# Patient Record
Sex: Male | Born: 1955 | Race: White | Hispanic: No | Marital: Single | Smoking: Former smoker
Health system: Southern US, Community
[De-identification: ages and names within clinical notes are randomized; demographics above are authoritative.]

## PROBLEM LIST (undated history)

## (undated) DIAGNOSIS — C61 Malignant neoplasm of prostate: Secondary | ICD-10-CM

## (undated) DIAGNOSIS — Z923 Personal history of irradiation: Secondary | ICD-10-CM

## (undated) HISTORY — PX: HERNIA REPAIR: SHX51

## (undated) HISTORY — PX: FOOT SURGERY: SHX648

---

## 2004-11-25 ENCOUNTER — Ambulatory Visit (HOSPITAL_COMMUNITY): Admission: RE | Admit: 2004-11-25 | Discharge: 2004-11-25 | Payer: Self-pay | Admitting: Surgery

## 2004-11-25 ENCOUNTER — Ambulatory Visit (HOSPITAL_BASED_OUTPATIENT_CLINIC_OR_DEPARTMENT_OTHER): Admission: RE | Admit: 2004-11-25 | Discharge: 2004-11-25 | Payer: Self-pay | Admitting: Surgery

## 2010-11-11 ENCOUNTER — Other Ambulatory Visit: Payer: Self-pay | Admitting: Gastroenterology

## 2010-11-11 ENCOUNTER — Ambulatory Visit (HOSPITAL_COMMUNITY)
Admission: RE | Admit: 2010-11-11 | Discharge: 2010-11-11 | Disposition: A | Discharge status: Home / Self Care | Payer: No Typology Code available for payment source | Source: Ambulatory Visit | Attending: Gastroenterology | Admitting: Gastroenterology

## 2010-11-11 DIAGNOSIS — Z860100 Personal history of colon polyps, unspecified: Secondary | ICD-10-CM | POA: Insufficient documentation

## 2010-11-11 DIAGNOSIS — D126 Benign neoplasm of colon, unspecified: Secondary | ICD-10-CM | POA: Insufficient documentation

## 2010-11-11 DIAGNOSIS — Z800 Family history of malignant neoplasm of digestive organs: Secondary | ICD-10-CM | POA: Insufficient documentation

## 2010-11-11 DIAGNOSIS — K5730 Diverticulosis of large intestine without perforation or abscess without bleeding: Secondary | ICD-10-CM | POA: Insufficient documentation

## 2010-11-16 NOTE — Op Note (Signed)
  Casey Wilcox, TREPTOW NO.:  [id number]  MEDICAL RECORD NO.:  [id number]           PATIENT TYPE:  O  LOCATION:  [facility]                         FACILITY:  [facility]  PHYSICIAN:  Danise Edge, M.D.   DATE OF BIRTH:  05-12-1956  DATE OF PROCEDURE:  11/11/2010 DATE OF DISCHARGE:                              OPERATIVE REPORT   PROCEDURE:  Surveillance colonoscopy.  REFERRING PHYSICIAN:  Candyce Churn, MD.  HISTORY:  Mr. Casey Wilcox is a 55 year old male born 12/13/1955.  The patient has undergone colonoscopic exams in the past to remove neoplastic polyps.  The patient is scheduled to undergo a surveillance colonoscopy with polypectomy to prevent colon cancer.  ENDOSCOPIST:  Danise Edge, M.D.  PREMEDICATIONS: 1. Fentanyl 100 mcg. 2. Versed 7.5 mg.  DESCRIPTION OF PROCEDURE:  After obtaining informed consent, the patient was placed in the left lateral decubitus position.  I administered intravenous fentanyl and intravenous Versed to achieve conscious sedation for the procedure.  The patient's blood pressure, oxygen saturation and cardiac rhythm were monitored throughout the procedure and documented in the medical record.  Anal inspection and digital rectal exam were normal.  The Pentax pediatric colonoscope was introduced into the rectum and easily advanced to the cecum.  A normal-appearing ileocecal valve and appendiceal orifice were identified.  Colonic preparation for the exam today was good.  Rectum normal.  Retroflex view of the distal rectum normal.  Sigmoid colon:  Left colonic diverticulosis.  From the distal sigmoid colon, a 3-mm sessile polyp was removed with the cold biopsy forceps.  Descending colon normal.  Splenic flexure normal.  Transverse colon normal.  Hepatic flexure normal.  Ascending colon normal.  Cecum and ileocecal valve: In the proximal cecum in the area of the appendiceal orifice, 2 diminutive sessile  polyps were removed with cold biopsy forceps.  ASSESSMENT: 1. Sigmoid colonic diverticulosis. 2. From the proximal cecum, 2 diminutive sessile polyps were removed     with the cold biopsy forceps. 3. From the distal sigmoid colon, a 3-mm sessile polyp was removed     with cold biopsy forceps.  RECOMMENDATIONS:  Schedule surveillance colonoscopy in 5 years.          ______________________________ Danise Edge, M.D.     MJ/MEDQ  D:  11/11/2010  T:  11/11/2010  Job:  [id number]  cc:   Candyce Churn, M.D. Fax: [phone]  Electronically Signed by Danise Edge M.D. on 11/16/2010 02:38:23 PM

## 2016-08-16 DIAGNOSIS — E119 Type 2 diabetes mellitus without complications: Secondary | ICD-10-CM | POA: Diagnosis not present

## 2016-08-16 DIAGNOSIS — Z125 Encounter for screening for malignant neoplasm of prostate: Secondary | ICD-10-CM | POA: Diagnosis not present

## 2016-08-16 DIAGNOSIS — E7800 Pure hypercholesterolemia, unspecified: Secondary | ICD-10-CM | POA: Diagnosis not present

## 2016-08-16 DIAGNOSIS — Z0000 Encounter for general adult medical examination without abnormal findings: Secondary | ICD-10-CM | POA: Diagnosis not present

## 2016-08-16 DIAGNOSIS — Z23 Encounter for immunization: Secondary | ICD-10-CM | POA: Diagnosis not present

## 2016-08-31 ENCOUNTER — Other Ambulatory Visit: Payer: Self-pay | Admitting: Gastroenterology

## 2016-09-14 DIAGNOSIS — H5212 Myopia, left eye: Secondary | ICD-10-CM | POA: Diagnosis not present

## 2016-09-14 DIAGNOSIS — H52201 Unspecified astigmatism, right eye: Secondary | ICD-10-CM | POA: Diagnosis not present

## 2016-09-14 DIAGNOSIS — H52202 Unspecified astigmatism, left eye: Secondary | ICD-10-CM | POA: Diagnosis not present

## 2016-09-14 DIAGNOSIS — H5201 Hypermetropia, right eye: Secondary | ICD-10-CM | POA: Diagnosis not present

## 2016-09-15 DIAGNOSIS — D2261 Melanocytic nevi of right upper limb, including shoulder: Secondary | ICD-10-CM | POA: Diagnosis not present

## 2016-09-15 DIAGNOSIS — L570 Actinic keratosis: Secondary | ICD-10-CM | POA: Diagnosis not present

## 2016-09-15 DIAGNOSIS — D2262 Melanocytic nevi of left upper limb, including shoulder: Secondary | ICD-10-CM | POA: Diagnosis not present

## 2016-09-15 DIAGNOSIS — Z85828 Personal history of other malignant neoplasm of skin: Secondary | ICD-10-CM | POA: Diagnosis not present

## 2016-09-15 DIAGNOSIS — L821 Other seborrheic keratosis: Secondary | ICD-10-CM | POA: Diagnosis not present

## 2016-12-20 ENCOUNTER — Encounter (HOSPITAL_COMMUNITY): Payer: Self-pay | Admitting: *Deleted

## 2016-12-20 ENCOUNTER — Encounter (HOSPITAL_COMMUNITY)
Admission: RE | Disposition: A | Discharge status: Home / Self Care | Payer: Self-pay | Source: Ambulatory Visit | Attending: Gastroenterology

## 2016-12-20 ENCOUNTER — Ambulatory Visit (HOSPITAL_COMMUNITY): Payer: BLUE CROSS/BLUE SHIELD | Admitting: Certified Registered Nurse Anesthetist

## 2016-12-20 ENCOUNTER — Ambulatory Visit (HOSPITAL_COMMUNITY)
Admission: RE | Admit: 2016-12-20 | Discharge: 2016-12-20 | Disposition: A | Discharge status: Home / Self Care | Payer: BLUE CROSS/BLUE SHIELD | Source: Ambulatory Visit | Attending: Gastroenterology | Admitting: Gastroenterology

## 2016-12-20 DIAGNOSIS — Z79899 Other long term (current) drug therapy: Secondary | ICD-10-CM | POA: Insufficient documentation

## 2016-12-20 DIAGNOSIS — E119 Type 2 diabetes mellitus without complications: Secondary | ICD-10-CM | POA: Insufficient documentation

## 2016-12-20 DIAGNOSIS — Z9889 Other specified postprocedural states: Secondary | ICD-10-CM | POA: Diagnosis not present

## 2016-12-20 DIAGNOSIS — Z800 Family history of malignant neoplasm of digestive organs: Secondary | ICD-10-CM | POA: Diagnosis not present

## 2016-12-20 DIAGNOSIS — Z85828 Personal history of other malignant neoplasm of skin: Secondary | ICD-10-CM | POA: Insufficient documentation

## 2016-12-20 DIAGNOSIS — D122 Benign neoplasm of ascending colon: Secondary | ICD-10-CM | POA: Diagnosis not present

## 2016-12-20 DIAGNOSIS — Z86010 Personal history of colonic polyps: Secondary | ICD-10-CM | POA: Diagnosis not present

## 2016-12-20 DIAGNOSIS — E7800 Pure hypercholesterolemia, unspecified: Secondary | ICD-10-CM | POA: Insufficient documentation

## 2016-12-20 DIAGNOSIS — D120 Benign neoplasm of cecum: Secondary | ICD-10-CM | POA: Diagnosis not present

## 2016-12-20 DIAGNOSIS — Z1211 Encounter for screening for malignant neoplasm of colon: Secondary | ICD-10-CM | POA: Diagnosis not present

## 2016-12-20 DIAGNOSIS — K5730 Diverticulosis of large intestine without perforation or abscess without bleeding: Secondary | ICD-10-CM | POA: Diagnosis not present

## 2016-12-20 HISTORY — PX: COLONOSCOPY WITH PROPOFOL: SHX5780

## 2016-12-20 SURGERY — COLONOSCOPY WITH PROPOFOL
Anesthesia: Monitor Anesthesia Care

## 2016-12-20 MED ORDER — LIDOCAINE 2% (20 MG/ML) 5 ML SYRINGE
INTRAMUSCULAR | Status: AC
  Filled 2016-12-20: qty 5

## 2016-12-20 MED ORDER — PROPOFOL 10 MG/ML IV BOLUS
INTRAVENOUS | Status: AC
  Filled 2016-12-20: qty 20

## 2016-12-20 MED ORDER — PROPOFOL 10 MG/ML IV BOLUS
INTRAVENOUS | Status: DC | PRN
  Administered 2016-12-20: 40 mg via INTRAVENOUS
  Administered 2016-12-20 (×2): 20 mg via INTRAVENOUS
  Administered 2016-12-20: 30 mg via INTRAVENOUS
  Administered 2016-12-20 (×3): 20 mg via INTRAVENOUS

## 2016-12-20 MED ORDER — SODIUM CHLORIDE 0.9 % IV SOLN: INTRAVENOUS | Status: DC

## 2016-12-20 MED ORDER — LIDOCAINE 2% (20 MG/ML) 5 ML SYRINGE
INTRAMUSCULAR | Status: DC | PRN
  Administered 2016-12-20: 100 mg via INTRAVENOUS

## 2016-12-20 MED ORDER — ONDANSETRON HCL 4 MG/2ML IJ SOLN
INTRAMUSCULAR | Status: DC | PRN
  Administered 2016-12-20: 4 mg via INTRAVENOUS

## 2016-12-20 MED ORDER — ONDANSETRON HCL 4 MG/2ML IJ SOLN
INTRAMUSCULAR | Status: AC
  Filled 2016-12-20: qty 2

## 2016-12-20 MED ORDER — PROPOFOL 500 MG/50ML IV EMUL
INTRAVENOUS | Status: DC | PRN
  Administered 2016-12-20: 75 ug/kg/min via INTRAVENOUS

## 2016-12-20 MED ORDER — LACTATED RINGERS IV SOLN
INTRAVENOUS | Status: DC
  Administered 2016-12-20: 1000 mL via INTRAVENOUS

## 2016-12-20 MED ORDER — PROPOFOL 10 MG/ML IV BOLUS
INTRAVENOUS | Status: AC
  Filled 2016-12-20: qty 60

## 2016-12-20 SURGICAL SUPPLY — 22 items

## 2016-12-20 NOTE — Transfer of Care (Signed)
Immediate Anesthesia Transfer of Care Note  Patient: Casey Wilcox  Procedure(s) Performed: Procedure(s): COLONOSCOPY WITH PROPOFOL (N/A)  Patient Location: ENDO  Anesthesia Type:MAC  Level of Consciousness:  sedated, patient cooperative and responds to stimulation  Airway & Oxygen Therapy:Patient Spontanous Breathing and Patient connected to face mask oxgen  Post-op Assessment:  Report given to ENDO RN and Post -op Vital signs reviewed and stable  Post vital signs:  Reviewed and stable  Last Vitals:  Vitals:   12/20/16 0658  BP: (!) 162/93  Pulse: 89  Resp: 20  Temp: 47.0 C    Complications: No apparent anesthesia complications

## 2016-12-20 NOTE — Op Note (Signed)
[facility] Patient Name: Casey Wilcox Procedure Date: 12/20/2016 MRN: [id number] Attending MD: Garlan Fair , MD Date of Birth: [birth year] CSN: [id number] Age: 61 Admit Type: Outpatient Procedure:                Colonoscopy Indications:              High risk colon cancer surveillance: Personal                            history of adenoma with villous component. Father                            died of colon cancer at age 76 Providers:                Garlan Fair, MD, Elmer Ramp. Tilden Dome, RN,                            William Dalton, Technician Referring MD:              Medicines:                Propofol per Anesthesia Complications:            No immediate complications. Estimated Blood Loss:     Estimated blood loss was minimal. Procedure:                Pre-Anesthesia Assessment:                           - Prior to the procedure, a History and Physical                            was performed, and patient medications and                            allergies were reviewed. The patient's tolerance of                            previous anesthesia was also reviewed. The risks                            and benefits of the procedure and the sedation                            options and risks were discussed with the patient.                            All questions were answered, and informed consent                            was obtained. Prior Anticoagulants: The patient has                            taken no previous anticoagulant or antiplatelet  agents. ASA Grade Assessment: II - A patient with                            mild systemic disease. After reviewing the risks                            and benefits, the patient was deemed in                            satisfactory condition to undergo the procedure.                           After obtaining informed consent, the colonoscope                            was  passed under direct vision. Throughout the                            procedure, the patient's blood pressure, pulse, and                            oxygen saturations were monitored continuously. The                            EC-[id number] ([id number]) scope was introduced through                            the anus and advanced to the the cecum, identified                            by appendiceal orifice and ileocecal valve. The                            colonoscopy was performed without difficulty. The                            patient tolerated the procedure well. The quality                            of the bowel preparation was good. The appendiceal                            orifice and the rectum were photographed. Scope In: 7:31:55 AM Scope Out: 8:16:07 AM Scope Withdrawal Time: 0 hours 34 minutes 31 seconds  Total Procedure Duration: 0 hours 44 minutes 12 seconds  Findings:      The perianal and digital rectal examinations were normal.      Two sessile polyps were found in the ascending colon. The polyps were 5       mm in size. These polyps were removed with a cold snare. Resection and       retrieval were complete.      A 5 mm polyp was found in the cecum. The polyp was sessile. The polyp       was removed with a  cold snare. Resection and retrieval were complete.      Four sessile polyps were found in the cecum. The polyps were 3 mm in       size. These polyps were removed with a cold biopsy forceps. Resection       and retrieval were complete.      The exam was otherwise without abnormality. Universal colonic       diverticulosis was present Impression:               - Two 5 mm polyps in the ascending colon, removed                            with a cold snare. Resected and retrieved.                           - One 5 mm polyp in the cecum, removed with a cold                            snare. Resected and retrieved.                           - Four 3 mm polyps in the cecum,  removed with a                            cold biopsy forceps. Resected and retrieved.                           - The examination was otherwise normal. Moderate Sedation:      N/A- Per Anesthesia Care Recommendation:           - Patient has a contact number available for                            emergencies. The signs and symptoms of potential                            delayed complications were discussed with the                            patient. Return to normal activities tomorrow.                            Written discharge instructions were provided to the                            patient.                           - Repeat colonoscopy date to be determined after                            pending pathology results are reviewed for                            surveillance.                           -  Resume previous diet.                           - Continue present medications. Procedure Code(s):        --- Professional ---                           [phone], Colonoscopy, flexible; with removal of                            tumor(s), polyp(s), or other lesion(s) by snare                            technique                           45380, 59, Colonoscopy, flexible; with biopsy,                            single or multiple Diagnosis Code(s):        --- Professional ---                           Z86.010, Personal history of colonic polyps                           D12.2, Benign neoplasm of ascending colon                           D12.0, Benign neoplasm of cecum CPT copyright 2016 American Medical Association. All rights reserved. The codes documented in this report are preliminary and upon coder review may  be revised to meet current compliance requirements. Earle Gell, MD Garlan Fair, MD 12/20/2016 8:22:04 AM This report has been signed electronically. Number of Addenda: 0

## 2016-12-20 NOTE — H&P (Signed)
Procedure: Surveillance colonoscopy. 07/24/2002 screening colonoscopy was performed with removal of a 1 cm ascending colon tubular adenomatous polyp. Normal surveillance colonoscopies were performed in February 2007 and 15-Dec-2010. Father died of colon cancer at age 62  History: The patient is a 61 year old male born 05-Apr-1956. He is scheduled to undergo a surveillance colonoscopy today.  Past medical history: Diet-controlled type 2 diabetes mellitus diagnosed in 2015. Hypercholesterolemia. Umbilical hernia. Squamous cell skin cancers. Right inguinal hernia repair. Right foot fracture surgery.  Family history: Father died of colon cancer at age 43  Exam: The patient is alert and lying comfortably on the endoscopy stretcher. Abdomen is soft and nontender to palpation. Lungs are clear to auscultation. Cardiac exam reveals a regular rhythm.  Plan: Proceed with surveillance colonoscopy

## 2016-12-20 NOTE — Discharge Instructions (Signed)

## 2016-12-20 NOTE — Anesthesia Postprocedure Evaluation (Addendum)
Anesthesia Post Note  Patient: Casey Wilcox  Procedure(s) Performed: Procedure(s) (LRB): COLONOSCOPY WITH PROPOFOL (N/A)  Patient location during evaluation: Endoscopy Anesthesia Type: MAC Level of consciousness: awake and alert Pain management: pain level controlled Vital Signs Assessment: post-procedure vital signs reviewed and stable Respiratory status: spontaneous breathing, nonlabored ventilation, respiratory function stable and patient connected to nasal cannula oxygen Cardiovascular status: stable and blood pressure returned to baseline Anesthetic complications: no       Last Vitals:  Vitals:   12/20/16 0840 12/20/16 0845  BP: (!) 147/95 (!) 160/81  Pulse: 84 85  Resp: 17 19  Temp:      Last Pain:  Vitals:   12/20/16 0824  TempSrc: Oral                 Davanee Klinkner,JAMES TERRILL

## 2016-12-20 NOTE — Anesthesia Preprocedure Evaluation (Addendum)
Anesthesia Evaluation  Patient identified by MRN, date of birth, ID band Patient awake    Reviewed: Allergy & Precautions, NPO status , Patient's Chart, lab work & pertinent test results  Airway Mallampati: II  TM Distance: >3 FB Neck ROM: Full    Dental  (+) Teeth Intact   Pulmonary neg pulmonary ROS, former smoker,    breath sounds clear to auscultation       Cardiovascular negative cardio ROS   Rhythm:Regular Rate:Normal     Neuro/Psych negative neurological ROS  negative psych ROS   GI/Hepatic negative GI ROS, Neg liver ROS,   Endo/Other  negative endocrine ROS  Renal/GU negative Renal ROS  negative genitourinary   Musculoskeletal negative musculoskeletal ROS (+)   Abdominal   Peds negative pediatric ROS (+)  Hematology negative hematology ROS (+)   Anesthesia Other Findings   Reproductive/Obstetrics negative OB ROS                            Anesthesia Physical Anesthesia Plan  ASA: I  Anesthesia Plan: MAC   Post-op Pain Management:    Induction: Intravenous  Airway Management Planned: Natural Airway and Simple Face Mask  Additional Equipment:   Intra-op Plan:   Post-operative Plan:   Informed Consent: I have reviewed the patients History and Physical, chart, labs and discussed the procedure including the risks, benefits and alternatives for the proposed anesthesia with the patient or authorized representative who has indicated his/her understanding and acceptance.     Plan Discussed with: CRNA  Anesthesia Plan Comments:         Anesthesia Quick Evaluation

## 2016-12-21 ENCOUNTER — Encounter (HOSPITAL_COMMUNITY): Payer: Self-pay | Admitting: Gastroenterology

## 2017-03-03 DIAGNOSIS — E1165 Type 2 diabetes mellitus with hyperglycemia: Secondary | ICD-10-CM | POA: Diagnosis not present

## 2017-03-03 DIAGNOSIS — E119 Type 2 diabetes mellitus without complications: Secondary | ICD-10-CM | POA: Diagnosis not present

## 2017-03-03 NOTE — Addendum Note (Signed)
Addendum  created 03/03/17 1235 by Rica Koyanagi, MD   Sign clinical note

## 2017-08-08 DIAGNOSIS — G479 Sleep disorder, unspecified: Secondary | ICD-10-CM | POA: Diagnosis not present

## 2017-08-08 DIAGNOSIS — Z23 Encounter for immunization: Secondary | ICD-10-CM | POA: Diagnosis not present

## 2017-08-08 DIAGNOSIS — E119 Type 2 diabetes mellitus without complications: Secondary | ICD-10-CM | POA: Diagnosis not present

## 2017-08-08 DIAGNOSIS — Z0000 Encounter for general adult medical examination without abnormal findings: Secondary | ICD-10-CM | POA: Diagnosis not present

## 2017-08-08 DIAGNOSIS — R972 Elevated prostate specific antigen [PSA]: Secondary | ICD-10-CM | POA: Diagnosis not present

## 2017-08-08 DIAGNOSIS — Z125 Encounter for screening for malignant neoplasm of prostate: Secondary | ICD-10-CM | POA: Diagnosis not present

## 2017-08-08 DIAGNOSIS — E7800 Pure hypercholesterolemia, unspecified: Secondary | ICD-10-CM | POA: Diagnosis not present

## 2017-09-14 DIAGNOSIS — H25013 Cortical age-related cataract, bilateral: Secondary | ICD-10-CM | POA: Diagnosis not present

## 2017-09-14 DIAGNOSIS — H52201 Unspecified astigmatism, right eye: Secondary | ICD-10-CM | POA: Diagnosis not present

## 2017-09-14 DIAGNOSIS — H52202 Unspecified astigmatism, left eye: Secondary | ICD-10-CM | POA: Diagnosis not present

## 2017-09-14 DIAGNOSIS — E119 Type 2 diabetes mellitus without complications: Secondary | ICD-10-CM | POA: Diagnosis not present

## 2017-09-14 DIAGNOSIS — H04123 Dry eye syndrome of bilateral lacrimal glands: Secondary | ICD-10-CM | POA: Diagnosis not present

## 2017-09-14 DIAGNOSIS — H5201 Hypermetropia, right eye: Secondary | ICD-10-CM | POA: Diagnosis not present

## 2017-09-14 DIAGNOSIS — H5212 Myopia, left eye: Secondary | ICD-10-CM | POA: Diagnosis not present

## 2017-09-14 DIAGNOSIS — H2513 Age-related nuclear cataract, bilateral: Secondary | ICD-10-CM | POA: Diagnosis not present

## 2017-09-19 DIAGNOSIS — D2262 Melanocytic nevi of left upper limb, including shoulder: Secondary | ICD-10-CM | POA: Diagnosis not present

## 2017-09-19 DIAGNOSIS — D225 Melanocytic nevi of trunk: Secondary | ICD-10-CM | POA: Diagnosis not present

## 2017-09-19 DIAGNOSIS — D2261 Melanocytic nevi of right upper limb, including shoulder: Secondary | ICD-10-CM | POA: Diagnosis not present

## 2017-09-19 DIAGNOSIS — Z85828 Personal history of other malignant neoplasm of skin: Secondary | ICD-10-CM | POA: Diagnosis not present

## 2017-12-08 DIAGNOSIS — H25813 Combined forms of age-related cataract, bilateral: Secondary | ICD-10-CM | POA: Diagnosis not present

## 2018-02-13 DIAGNOSIS — Z1159 Encounter for screening for other viral diseases: Secondary | ICD-10-CM | POA: Diagnosis not present

## 2018-02-13 DIAGNOSIS — R972 Elevated prostate specific antigen [PSA]: Secondary | ICD-10-CM | POA: Diagnosis not present

## 2018-02-13 DIAGNOSIS — E7800 Pure hypercholesterolemia, unspecified: Secondary | ICD-10-CM | POA: Diagnosis not present

## 2018-02-13 DIAGNOSIS — E119 Type 2 diabetes mellitus without complications: Secondary | ICD-10-CM | POA: Diagnosis not present

## 2018-08-09 DIAGNOSIS — R972 Elevated prostate specific antigen [PSA]: Secondary | ICD-10-CM | POA: Diagnosis not present

## 2018-08-09 DIAGNOSIS — E782 Mixed hyperlipidemia: Secondary | ICD-10-CM | POA: Diagnosis not present

## 2018-08-09 DIAGNOSIS — E119 Type 2 diabetes mellitus without complications: Secondary | ICD-10-CM | POA: Diagnosis not present

## 2018-08-09 DIAGNOSIS — Z1159 Encounter for screening for other viral diseases: Secondary | ICD-10-CM | POA: Diagnosis not present

## 2018-08-09 DIAGNOSIS — E781 Pure hyperglyceridemia: Secondary | ICD-10-CM | POA: Diagnosis not present

## 2018-08-14 DIAGNOSIS — Z23 Encounter for immunization: Secondary | ICD-10-CM | POA: Diagnosis not present

## 2018-08-14 DIAGNOSIS — E7800 Pure hypercholesterolemia, unspecified: Secondary | ICD-10-CM | POA: Diagnosis not present

## 2018-08-14 DIAGNOSIS — E119 Type 2 diabetes mellitus without complications: Secondary | ICD-10-CM | POA: Diagnosis not present

## 2018-08-14 DIAGNOSIS — Z0000 Encounter for general adult medical examination without abnormal findings: Secondary | ICD-10-CM | POA: Diagnosis not present

## 2018-08-14 DIAGNOSIS — R972 Elevated prostate specific antigen [PSA]: Secondary | ICD-10-CM | POA: Diagnosis not present

## 2018-09-19 DIAGNOSIS — L814 Other melanin hyperpigmentation: Secondary | ICD-10-CM | POA: Diagnosis not present

## 2018-09-19 DIAGNOSIS — D225 Melanocytic nevi of trunk: Secondary | ICD-10-CM | POA: Diagnosis not present

## 2018-09-19 DIAGNOSIS — Z85828 Personal history of other malignant neoplasm of skin: Secondary | ICD-10-CM | POA: Diagnosis not present

## 2018-09-19 DIAGNOSIS — D2261 Melanocytic nevi of right upper limb, including shoulder: Secondary | ICD-10-CM | POA: Diagnosis not present

## 2018-12-12 DIAGNOSIS — R972 Elevated prostate specific antigen [PSA]: Secondary | ICD-10-CM | POA: Diagnosis not present

## 2018-12-12 DIAGNOSIS — K4090 Unilateral inguinal hernia, without obstruction or gangrene, not specified as recurrent: Secondary | ICD-10-CM | POA: Diagnosis not present

## 2018-12-12 DIAGNOSIS — R350 Frequency of micturition: Secondary | ICD-10-CM | POA: Diagnosis not present

## 2018-12-12 DIAGNOSIS — N401 Enlarged prostate with lower urinary tract symptoms: Secondary | ICD-10-CM | POA: Diagnosis not present

## 2018-12-12 DIAGNOSIS — F458 Other somatoform disorders: Secondary | ICD-10-CM | POA: Diagnosis not present

## 2019-02-13 DIAGNOSIS — R972 Elevated prostate specific antigen [PSA]: Secondary | ICD-10-CM | POA: Diagnosis not present

## 2019-02-13 DIAGNOSIS — C61 Malignant neoplasm of prostate: Secondary | ICD-10-CM | POA: Diagnosis not present

## 2019-02-18 DIAGNOSIS — C61 Malignant neoplasm of prostate: Secondary | ICD-10-CM | POA: Diagnosis not present

## 2019-02-18 DIAGNOSIS — R040 Epistaxis: Secondary | ICD-10-CM | POA: Diagnosis not present

## 2019-02-18 DIAGNOSIS — F458 Other somatoform disorders: Secondary | ICD-10-CM | POA: Diagnosis not present

## 2019-02-18 DIAGNOSIS — E119 Type 2 diabetes mellitus without complications: Secondary | ICD-10-CM | POA: Diagnosis not present

## 2019-02-20 DIAGNOSIS — E7800 Pure hypercholesterolemia, unspecified: Secondary | ICD-10-CM | POA: Diagnosis not present

## 2019-02-20 DIAGNOSIS — R040 Epistaxis: Secondary | ICD-10-CM | POA: Diagnosis not present

## 2019-02-20 DIAGNOSIS — E119 Type 2 diabetes mellitus without complications: Secondary | ICD-10-CM | POA: Diagnosis not present

## 2019-02-20 DIAGNOSIS — E781 Pure hyperglyceridemia: Secondary | ICD-10-CM | POA: Diagnosis not present

## 2019-02-26 DIAGNOSIS — C61 Malignant neoplasm of prostate: Secondary | ICD-10-CM | POA: Diagnosis not present

## 2019-03-05 DIAGNOSIS — R040 Epistaxis: Secondary | ICD-10-CM | POA: Diagnosis not present

## 2019-03-05 DIAGNOSIS — Z77122 Contact with and (suspected) exposure to noise: Secondary | ICD-10-CM | POA: Diagnosis not present

## 2019-03-05 DIAGNOSIS — Z7289 Other problems related to lifestyle: Secondary | ICD-10-CM | POA: Diagnosis not present

## 2019-03-05 DIAGNOSIS — H93292 Other abnormal auditory perceptions, left ear: Secondary | ICD-10-CM | POA: Diagnosis not present

## 2019-03-07 DIAGNOSIS — C61 Malignant neoplasm of prostate: Secondary | ICD-10-CM | POA: Diagnosis not present

## 2019-03-12 DIAGNOSIS — H9042 Sensorineural hearing loss, unilateral, left ear, with unrestricted hearing on the contralateral side: Secondary | ICD-10-CM | POA: Diagnosis not present

## 2019-03-19 ENCOUNTER — Other Ambulatory Visit: Payer: Self-pay | Admitting: Otolaryngology

## 2019-03-19 DIAGNOSIS — H918X2 Other specified hearing loss, left ear: Secondary | ICD-10-CM

## 2019-03-19 DIAGNOSIS — IMO0001 Reserved for inherently not codable concepts without codable children: Secondary | ICD-10-CM

## 2019-04-03 ENCOUNTER — Other Ambulatory Visit: Payer: Self-pay

## 2019-04-03 ENCOUNTER — Ambulatory Visit
Admission: RE | Admit: 2019-04-03 | Discharge: 2019-04-03 | Disposition: A | Discharge status: Home / Self Care | Payer: BC Managed Care – PPO | Source: Ambulatory Visit | Attending: Otolaryngology | Admitting: Otolaryngology

## 2019-04-03 DIAGNOSIS — IMO0001 Reserved for inherently not codable concepts without codable children: Secondary | ICD-10-CM

## 2019-04-03 DIAGNOSIS — G9389 Other specified disorders of brain: Secondary | ICD-10-CM | POA: Diagnosis not present

## 2019-04-03 DIAGNOSIS — H918X2 Other specified hearing loss, left ear: Secondary | ICD-10-CM

## 2019-04-03 MED ORDER — GADOBENATE DIMEGLUMINE 529 MG/ML IV SOLN
14.0000 mL | Freq: Once | INTRAVENOUS | Status: AC | PRN
  Administered 2019-04-03: 14 mL via INTRAVENOUS

## 2019-05-24 DIAGNOSIS — E119 Type 2 diabetes mellitus without complications: Secondary | ICD-10-CM | POA: Diagnosis not present

## 2019-06-03 DIAGNOSIS — Z7289 Other problems related to lifestyle: Secondary | ICD-10-CM | POA: Diagnosis not present

## 2019-06-03 DIAGNOSIS — H9042 Sensorineural hearing loss, unilateral, left ear, with unrestricted hearing on the contralateral side: Secondary | ICD-10-CM | POA: Diagnosis not present

## 2019-06-03 DIAGNOSIS — D333 Benign neoplasm of cranial nerves: Secondary | ICD-10-CM | POA: Diagnosis not present

## 2019-07-10 DIAGNOSIS — Z23 Encounter for immunization: Secondary | ICD-10-CM | POA: Diagnosis not present

## 2019-07-16 DIAGNOSIS — H52203 Unspecified astigmatism, bilateral: Secondary | ICD-10-CM | POA: Diagnosis not present

## 2019-07-16 DIAGNOSIS — H524 Presbyopia: Secondary | ICD-10-CM | POA: Diagnosis not present

## 2019-07-16 DIAGNOSIS — H5212 Myopia, left eye: Secondary | ICD-10-CM | POA: Diagnosis not present

## 2019-07-16 DIAGNOSIS — H25013 Cortical age-related cataract, bilateral: Secondary | ICD-10-CM | POA: Diagnosis not present

## 2019-07-16 DIAGNOSIS — H2513 Age-related nuclear cataract, bilateral: Secondary | ICD-10-CM | POA: Diagnosis not present

## 2019-07-16 DIAGNOSIS — Z7984 Long term (current) use of oral hypoglycemic drugs: Secondary | ICD-10-CM | POA: Diagnosis not present

## 2019-07-16 DIAGNOSIS — E119 Type 2 diabetes mellitus without complications: Secondary | ICD-10-CM | POA: Diagnosis not present

## 2019-07-16 DIAGNOSIS — H5201 Hypermetropia, right eye: Secondary | ICD-10-CM | POA: Diagnosis not present

## 2019-08-20 ENCOUNTER — Other Ambulatory Visit: Payer: Self-pay

## 2019-08-20 DIAGNOSIS — Z20822 Contact with and (suspected) exposure to covid-19: Secondary | ICD-10-CM

## 2019-08-22 DIAGNOSIS — Z86010 Personal history of colonic polyps: Secondary | ICD-10-CM | POA: Diagnosis not present

## 2019-08-22 DIAGNOSIS — C61 Malignant neoplasm of prostate: Secondary | ICD-10-CM | POA: Diagnosis not present

## 2019-08-22 DIAGNOSIS — Z0000 Encounter for general adult medical examination without abnormal findings: Secondary | ICD-10-CM | POA: Diagnosis not present

## 2019-08-22 DIAGNOSIS — E119 Type 2 diabetes mellitus without complications: Secondary | ICD-10-CM | POA: Diagnosis not present

## 2019-08-22 DIAGNOSIS — E7800 Pure hypercholesterolemia, unspecified: Secondary | ICD-10-CM | POA: Diagnosis not present

## 2019-08-22 LAB — NOVEL CORONAVIRUS, NAA: SARS-CoV-2, NAA: NOT DETECTED

## 2019-09-09 DIAGNOSIS — R945 Abnormal results of liver function studies: Secondary | ICD-10-CM | POA: Diagnosis not present

## 2019-09-23 DIAGNOSIS — Z85828 Personal history of other malignant neoplasm of skin: Secondary | ICD-10-CM | POA: Diagnosis not present

## 2019-09-23 DIAGNOSIS — D2261 Melanocytic nevi of right upper limb, including shoulder: Secondary | ICD-10-CM | POA: Diagnosis not present

## 2019-09-23 DIAGNOSIS — D2262 Melanocytic nevi of left upper limb, including shoulder: Secondary | ICD-10-CM | POA: Diagnosis not present

## 2019-09-23 DIAGNOSIS — D225 Melanocytic nevi of trunk: Secondary | ICD-10-CM | POA: Diagnosis not present

## 2019-11-04 ENCOUNTER — Ambulatory Visit: Payer: BC Managed Care – PPO | Attending: Internal Medicine

## 2019-11-04 DIAGNOSIS — Z20822 Contact with and (suspected) exposure to covid-19: Secondary | ICD-10-CM | POA: Diagnosis not present

## 2019-11-05 LAB — NOVEL CORONAVIRUS, NAA: SARS-CoV-2, NAA: NOT DETECTED

## 2019-11-08 DIAGNOSIS — C61 Malignant neoplasm of prostate: Secondary | ICD-10-CM | POA: Diagnosis not present

## 2019-11-15 DIAGNOSIS — C61 Malignant neoplasm of prostate: Secondary | ICD-10-CM | POA: Diagnosis not present

## 2019-11-19 ENCOUNTER — Other Ambulatory Visit: Payer: BC Managed Care – PPO

## 2019-11-19 DIAGNOSIS — H9042 Sensorineural hearing loss, unilateral, left ear, with unrestricted hearing on the contralateral side: Secondary | ICD-10-CM | POA: Diagnosis not present

## 2019-11-19 DIAGNOSIS — IMO0001 Reserved for inherently not codable concepts without codable children: Secondary | ICD-10-CM | POA: Insufficient documentation

## 2019-11-19 DIAGNOSIS — H905 Unspecified sensorineural hearing loss: Secondary | ICD-10-CM | POA: Diagnosis not present

## 2019-11-19 DIAGNOSIS — H832X2 Labyrinthine dysfunction, left ear: Secondary | ICD-10-CM | POA: Diagnosis not present

## 2019-11-19 DIAGNOSIS — D333 Benign neoplasm of cranial nerves: Secondary | ICD-10-CM | POA: Diagnosis not present

## 2019-11-20 ENCOUNTER — Ambulatory Visit: Payer: BC Managed Care – PPO | Attending: Internal Medicine

## 2019-11-20 DIAGNOSIS — Z20822 Contact with and (suspected) exposure to covid-19: Secondary | ICD-10-CM

## 2019-11-22 LAB — NOVEL CORONAVIRUS, NAA: SARS-CoV-2, NAA: NOT DETECTED

## 2019-12-03 DIAGNOSIS — D333 Benign neoplasm of cranial nerves: Secondary | ICD-10-CM | POA: Diagnosis not present

## 2019-12-11 DIAGNOSIS — D333 Benign neoplasm of cranial nerves: Secondary | ICD-10-CM | POA: Diagnosis not present

## 2019-12-13 DIAGNOSIS — D333 Benign neoplasm of cranial nerves: Secondary | ICD-10-CM | POA: Diagnosis not present

## 2019-12-13 DIAGNOSIS — H9042 Sensorineural hearing loss, unilateral, left ear, with unrestricted hearing on the contralateral side: Secondary | ICD-10-CM | POA: Diagnosis not present

## 2019-12-24 DIAGNOSIS — Z86010 Personal history of colonic polyps: Secondary | ICD-10-CM | POA: Diagnosis not present

## 2019-12-24 DIAGNOSIS — C61 Malignant neoplasm of prostate: Secondary | ICD-10-CM | POA: Diagnosis not present

## 2019-12-24 DIAGNOSIS — E119 Type 2 diabetes mellitus without complications: Secondary | ICD-10-CM | POA: Diagnosis not present

## 2019-12-24 DIAGNOSIS — R945 Abnormal results of liver function studies: Secondary | ICD-10-CM | POA: Diagnosis not present

## 2020-02-13 ENCOUNTER — Other Ambulatory Visit: Payer: Self-pay | Admitting: Urology

## 2020-02-13 DIAGNOSIS — C61 Malignant neoplasm of prostate: Secondary | ICD-10-CM

## 2020-03-16 DIAGNOSIS — C61 Malignant neoplasm of prostate: Secondary | ICD-10-CM | POA: Diagnosis not present

## 2020-03-20 ENCOUNTER — Other Ambulatory Visit: Payer: Self-pay

## 2020-03-20 ENCOUNTER — Ambulatory Visit
Admission: RE | Admit: 2020-03-20 | Discharge: 2020-03-20 | Disposition: A | Discharge status: Home / Self Care | Payer: BC Managed Care – PPO | Source: Ambulatory Visit | Attending: Urology | Admitting: Urology

## 2020-03-20 DIAGNOSIS — C61 Malignant neoplasm of prostate: Secondary | ICD-10-CM

## 2020-03-20 MED ORDER — GADOBENATE DIMEGLUMINE 529 MG/ML IV SOLN
13.0000 mL | Freq: Once | INTRAVENOUS | Status: AC | PRN
  Administered 2020-03-20: 13 mL via INTRAVENOUS

## 2020-04-28 DIAGNOSIS — E119 Type 2 diabetes mellitus without complications: Secondary | ICD-10-CM | POA: Diagnosis not present

## 2020-04-28 DIAGNOSIS — C61 Malignant neoplasm of prostate: Secondary | ICD-10-CM | POA: Diagnosis not present

## 2020-05-20 DIAGNOSIS — K5730 Diverticulosis of large intestine without perforation or abscess without bleeding: Secondary | ICD-10-CM | POA: Diagnosis not present

## 2020-05-20 DIAGNOSIS — Z86010 Personal history of colonic polyps: Secondary | ICD-10-CM | POA: Diagnosis not present

## 2020-05-20 DIAGNOSIS — K635 Polyp of colon: Secondary | ICD-10-CM | POA: Diagnosis not present

## 2020-07-13 ENCOUNTER — Ambulatory Visit: Payer: BC Managed Care – PPO | Attending: Internal Medicine

## 2020-07-13 DIAGNOSIS — Z23 Encounter for immunization: Secondary | ICD-10-CM

## 2020-07-13 NOTE — Progress Notes (Signed)
° °  Covid-19 Vaccination Clinic  Name:  Casey Wilcox    MRN: [id number] DOB: June 16, 1956  07/13/2020  [name] was observed post Covid-19 immunization for 15 minutes without incident. He was provided with Vaccine Information Sheet and instruction to access the V-Safe system.   [name] was instructed to call 911 with any severe reactions post vaccine:  Difficulty breathing   Swelling of face and throat   A fast heartbeat   A bad rash all over body   Dizziness and weakness

## 2020-11-03 DIAGNOSIS — U071 COVID-19: Secondary | ICD-10-CM

## 2020-11-03 HISTORY — DX: COVID-19: U07.1

## 2020-12-28 ENCOUNTER — Encounter: Payer: Self-pay | Admitting: Radiation Oncology

## 2020-12-28 NOTE — Progress Notes (Signed)
Radiation Oncology         [phone] ________________________________  Initial outpatient Consultation  Name: Casey Wilcox MRN: [id number]  Date: 12/29/2020  DOB: 1956/07/24  LD:[id number], Casey Reichmann, MD  Franchot Gallo, MD   REFERRING PHYSICIAN: Franchot Gallo, MD  DIAGNOSIS: 65 y.o. gentleman with Stage T1c adenocarcinoma of the prostate with Gleason score of 3+4, and PSA of 3.82.    ICD-10-CM   1. Prostate cancer [facility])  [place] [facility]ulatory referral to Social Work    HISTORY OF PRESENT ILLNESS: Casey Wilcox is a 65 y.o. male with a diagnosis of prostate cancer. He was initially referred to Dr. Diona Fanti in 12/2018 with a rising, elevated PSA of 4.97. He underwent prostate biopsy on 02/13/2019 and was found to have prostate cancer in 2/12 cores, left apex lateral with 20% Gleason 3+4 and a right mid lateral with 5% Gleason 3+3. Oncotype DX score was performed showing a score of 8, which is considered low risk. With this information, they opted for active surveillance.  His next PSA in 11/2019 showed a decrease to 3.55. He underwent surveillance prostate MRI on 03/20/20 showing no evidence of high-grade carcinoma or lymphadenopathy. The patient proceeded to surveillance transrectal ultrasound with 12 biopsies of the prostate on 09/07/20.  The prostate volume measured 49.42 cc.  Out of 12 core biopsies, 3 were positive.  The maximum Gleason score was 3+4, and this was seen in the left apex lateral. Additionally, Gleason 3+3 was seen in the right apex (small focus) and right mid lateral. His most recent PSA from 12/04/20 was stable at 3.82.  The patient reviewed the biopsy results with his urologist and he has kindly been referred today for discussion of potential radiation treatment options.   PREVIOUS RADIATION THERAPY: Yes  2020 for acoustic neuroma ([facility])  PAST MEDICAL HISTORY:  Past Medical History:  Diagnosis Date  . Prostate cancer ([place])       PAST SURGICAL HISTORY: Past  Surgical History:  Procedure Laterality Date  . COLONOSCOPY WITH PROPOFOL N/A 12/20/2016   Procedure: COLONOSCOPY WITH PROPOFOL;  Surgeon: Garlan Fair, MD;  Location: [facility] ENDOSCOPY;  Service: Endoscopy;  Laterality: N/A;  . FOOT SURGERY Bilateral   . HERNIA REPAIR      FAMILY HISTORY:  Family History  Problem Relation Age of Onset  . Colon cancer Mother   . Colon cancer Father   . Prostate cancer Paternal Uncle   . Head & neck cancer Paternal Uncle   . Breast cancer Neg Hx   . Pancreatic cancer Neg Hx     SOCIAL HISTORY:  Social History   Socioeconomic History  . Marital status: Single    Spouse name: Not on file  . Number of children: 0  . Years of education: Not on file  . Highest education level: Not on file  Occupational History    Comment: real estate  Tobacco Use  . Smoking status: Former Smoker    Packs/day: 1.00    Years: 6.00    Pack years: 6.00    Types: Cigarettes    Quit date: 10/04/1975    Years since quitting: 45.2  . Smokeless tobacco: Never Used  Vaping Use  . Vaping Use: Never used  Substance and Sexual Activity  . Alcohol use: Yes    Comment: occasional  . Drug use: No  . Sexual activity: Yes  Other Topics Concern  . Not on file  Social History Narrative  . Not on file   Social Determinants of Health  Financial Resource Strain: Not on file  Food Insecurity: Not on file  Transportation Needs: Not on file  Physical Activity: Not on file  Stress: Not on file  Social Connections: Not on file  Intimate Partner Violence: Not on file    ALLERGIES: Patient has no known allergies.  MEDICATIONS:  Current Outpatient Medications  Medication Sig Dispense Refill  . atorvastatin (LIPITOR) 40 MG tablet Take 40 mg by mouth daily.    Marland Kitchen ibuprofen (ADVIL) 200 MG tablet 3 tablet as needed     No current facility-administered medications for this encounter.    REVIEW OF SYSTEMS:  On review of systems, the patient reports that he is doing well  overall. He denies any chest pain, shortness of breath, cough, fevers, chills, night sweats, unintended weight changes. He denies any bowel disturbances, and denies abdominal pain, nausea or vomiting. He denies any new musculoskeletal or joint aches or pains. His IPSS was 18, indicating moderate urinary symptoms with mostly irritative voiding symptoms with nocturia x3/night, daytime frequency and urgency. His SHIM was 18, indicating he has moderate erectile dysfunction. A complete review of systems is obtained and is otherwise negative.    PHYSICAL EXAM:  Wt Readings from Last 3 Encounters:  12/29/20 150 lb 8 oz (68.3 kg)  12/20/16 155 lb (70.3 kg)   Temp Readings from Last 3 Encounters:  12/29/20 (!) 97 F (36.1 C) (Temporal)  12/20/16 97.8 F (36.6 C) (Oral)   BP Readings from Last 3 Encounters:  12/29/20 136/80  12/20/16 (!) 160/81   Pulse Readings from Last 3 Encounters:  12/29/20 90  12/20/16 85   Pain Assessment Pain Score: 0-No pain/10  In general this is a well appearing Caucasian male in no acute distress. He's alert and oriented x4 and appropriate throughout the examination. Cardiopulmonary assessment is negative for acute distress, and he exhibits normal effort.     KPS = 100  100 - Normal; no complaints; no evidence of disease. 90   - Able to carry on normal activity; minor signs or symptoms of disease. 80   - Normal activity with effort; some signs or symptoms of disease. 50   - Cares for self; unable to carry on normal activity or to do active work. 60   - Requires occasional assistance, but is able to care for most of his personal needs. 50   - Requires considerable assistance and frequent medical care. 25   - Disabled; requires special care and assistance. 73   - Severely disabled; hospital admission is indicated although death not imminent. 44   - Very sick; hospital admission necessary; active supportive treatment necessary. 10   - Moribund; fatal processes  progressing rapidly. 0     - Dead  Karnofsky DA, Abelmann WH, Craver LS and Burchenal JH [phone]) The use of the nitrogen mustards in the palliative treatment of carcinoma: with particular reference to bronchogenic carcinoma Cancer 1 [phone]  LABORATORY DATA:  No results found for: WBC, HGB, HCT, MCV, PLT No results found for: NA, K, CL, CO2 No results found for: ALT, AST, GGT, ALKPHOS, BILITOT   RADIOGRAPHY: No results found.    IMPRESSION/PLAN: 1. 65 y.o. gentleman with Stage T1c adenocarcinoma of the prostate with Gleason Score of 3+4, and PSA of 3.82. We discussed the patient's workup and outlined the nature of prostate cancer in this setting. The patient's T stage, Gleason's score, and PSA put him into the favorable intermediate risk group. Accordingly, he is eligible for a variety of  potential treatment options including brachytherapy, 5.5 weeks of external radiation, or prostatectomy. We discussed the available radiation techniques, and focused on the details and logistics of delivery. We discussed and outlined the risks, benefits, short and long-term effects associated with radiotherapy and compared and contrasted these with prostatectomy. We discussed the role of SpaceOAR gel in reducing the rectal toxicity associated with radiotherapy.  He appears to have a good understanding of his disease and our treatment recommendations which are of curative intent.  He was encouraged to ask questions that were answered to his stated satisfaction.  At the conclusion of our conversation, the patient is interested in moving forward with brachytherapy and use of SpaceOAR gel to reduce rectal toxicity from radiotherapy.  We will share our discussion with Dr. Diona Fanti and move forward with scheduling his [facility] planning appointment in the near future.  The patient met briefly with Romie Jumper in our office who will be working closely with him to coordinate OR scheduling and pre and post procedure  appointments.  We will contact the pharmaceutical rep to ensure that [place] is available at the time of procedure.  We enjoyed meeting him today and look forward to continuing to participate in his care.    Casey Johns, PA-C    Tyler Pita, MD  [facility] Direct Dial: [phone]  Fax: [phone] [email or web address]  Skype  [email or web address]   This document serves as a record of services personally performed by Tyler Pita, MD and Freeman Caldron, PA-C. It was created on their behalf by Wilburn Mylar, a trained medical scribe. The creation of this record is based on the scribe's personal observations and the provider's statements to them. This document has been checked and approved by the attending provider.

## 2020-12-28 NOTE — Progress Notes (Signed)
GU Location of Tumor / Histology: prostatic adenocarcinoma  If Prostate Cancer, Gleason Score is (3 + 4) and PSA is (3.55). Prostate volume: 49.42 g.  Nivan Melendrez was diagnosed with prostate cancer originally on 02/13/2019. He opted for active surveillance. Surveillance biopsy done 12/10/2020 revealed progression.  Biopsies of prostate (if applicable) revealed:   Past/Anticipated interventions by urology, if any: diagnosis, active surveillance, surveillance biopsy, referral to Dr. Tammi Klippel for consideration of brachytherapy  Past/Anticipated interventions by medical oncology, if any: no  Weight changes, if any: no  Bowel/Bladder complaints, if DEY:CXKG 18. SHIM 18. Denies dysuria, hematuria, urinary leakage or incontinence. Denies any bowel complaints.   Nausea/Vomiting, if any: no  Pain issues, if any:  denies  SAFETY ISSUES:  Prior radiation? Yes at [facility] in 2020 for acoustic neuroma  Pacemaker/ICD? denies  Possible current pregnancy? no, male patient  Is the patient on methotrexate? no  Current Complaints / other details:  65 year old male. Single. Mother and father with hx of colon cancer.

## 2020-12-29 ENCOUNTER — Other Ambulatory Visit: Payer: Self-pay

## 2020-12-29 ENCOUNTER — Ambulatory Visit
Admission: RE | Admit: 2020-12-29 | Discharge: 2020-12-29 | Disposition: A | Discharge status: Home / Self Care | Payer: Medicare Other | Source: Ambulatory Visit | Attending: Radiation Oncology | Admitting: Radiation Oncology

## 2020-12-29 ENCOUNTER — Encounter: Payer: Self-pay | Admitting: Medical Oncology

## 2020-12-29 ENCOUNTER — Encounter: Payer: Self-pay | Admitting: Radiation Oncology

## 2020-12-29 VITALS — BP 136/80 | HR 90 | Temp 97.0°F | Resp 18 | Ht 66.0 in | Wt 150.5 lb

## 2020-12-29 DIAGNOSIS — D333 Benign neoplasm of cranial nerves: Secondary | ICD-10-CM | POA: Insufficient documentation

## 2020-12-29 DIAGNOSIS — C61 Malignant neoplasm of prostate: Secondary | ICD-10-CM

## 2020-12-29 DIAGNOSIS — E7800 Pure hypercholesterolemia, unspecified: Secondary | ICD-10-CM | POA: Insufficient documentation

## 2020-12-29 DIAGNOSIS — Z86010 Personal history of colonic polyps: Secondary | ICD-10-CM | POA: Insufficient documentation

## 2020-12-29 DIAGNOSIS — M19019 Primary osteoarthritis, unspecified shoulder: Secondary | ICD-10-CM | POA: Insufficient documentation

## 2020-12-29 DIAGNOSIS — K5730 Diverticulosis of large intestine without perforation or abscess without bleeding: Secondary | ICD-10-CM | POA: Insufficient documentation

## 2020-12-29 DIAGNOSIS — Z800 Family history of malignant neoplasm of digestive organs: Secondary | ICD-10-CM | POA: Diagnosis not present

## 2020-12-29 DIAGNOSIS — G479 Sleep disorder, unspecified: Secondary | ICD-10-CM | POA: Insufficient documentation

## 2020-12-29 DIAGNOSIS — Z79899 Other long term (current) drug therapy: Secondary | ICD-10-CM | POA: Insufficient documentation

## 2020-12-29 DIAGNOSIS — Z87891 Personal history of nicotine dependence: Secondary | ICD-10-CM | POA: Insufficient documentation

## 2020-12-29 DIAGNOSIS — Z808 Family history of malignant neoplasm of other organs or systems: Secondary | ICD-10-CM | POA: Diagnosis not present

## 2020-12-29 DIAGNOSIS — C44320 Squamous cell carcinoma of skin of unspecified parts of face: Secondary | ICD-10-CM | POA: Insufficient documentation

## 2020-12-29 DIAGNOSIS — E119 Type 2 diabetes mellitus without complications: Secondary | ICD-10-CM | POA: Insufficient documentation

## 2020-12-29 HISTORY — DX: Malignant neoplasm of prostate: C61

## 2020-12-30 ENCOUNTER — Encounter: Payer: Self-pay | Admitting: Licensed Clinical Social Worker

## 2020-12-30 NOTE — Progress Notes (Signed)
[place] Psychosocial Distress Screening Clinical Social Work  Clinical Social Work was referred by distress screening protocol.  The patient scored a 5 on the Psychosocial Distress Thermometer which indicates moderate distress. Clinical Social Worker attempted to contact patient by phone to assess for distress and other psychosocial needs.  No answer. Left detailed VM explaining support services on identified voice mailbox. Provided direct contact information.  ON[facility] DISTRESS SCREENING 12/29/2020  Screening Type Initial Screening  Distress experienced in past week (1-10) 5  Emotional problem type Nervousness/Anxiety      Vann Okerlund E Haille Pardi, LCSW

## 2021-01-01 ENCOUNTER — Telehealth: Payer: Self-pay | Admitting: *Deleted

## 2021-01-01 NOTE — Telephone Encounter (Signed)
CALLED PATIENT TO UPDATE, SPOKE WITH PATIENT 

## 2021-01-01 NOTE — Telephone Encounter (Signed)
XXX

## 2021-01-06 ENCOUNTER — Other Ambulatory Visit: Payer: Self-pay | Admitting: Urology

## 2021-01-08 ENCOUNTER — Telehealth: Payer: Self-pay | Admitting: *Deleted

## 2021-01-08 NOTE — Telephone Encounter (Signed)
Called patient to give info., lvm for a return call. 

## 2021-01-08 NOTE — Telephone Encounter (Signed)
Called patient to inform of pre-seed appts. for 02-11-21 and his implant for 04-02-21, spoke with patient and he is aware of these appts.

## 2021-01-11 ENCOUNTER — Encounter: Payer: Self-pay | Admitting: Medical Oncology

## 2021-01-11 NOTE — Progress Notes (Signed)
Spoke with patient regarding implant date and vacation plans. Per Ashlyn, PA, he should not do any strenuous activity for 2-3 weeks post brachytherapy. His implant is scheduled for 7/1 and he leaves for vacation 7/4. I informed him we can reschedule surgery post vacation. He states he would like to reschedule. He is aware Enid Derry will contact him regarding new date and time. Message forwarded to [employer].

## 2021-01-26 DIAGNOSIS — D333 Benign neoplasm of cranial nerves: Secondary | ICD-10-CM

## 2021-01-26 HISTORY — DX: Benign neoplasm of cranial nerves: D33.3

## 2021-02-10 ENCOUNTER — Telehealth: Payer: Self-pay | Admitting: *Deleted

## 2021-02-10 NOTE — Telephone Encounter (Signed)
CALLED PATIENT TO REMIND OF PRE-SEED APPTS. FOR 02-11-21, SPOKE WITH PATIENT AND HE IS AWARE OF THESE APPTS. 

## 2021-02-11 ENCOUNTER — Ambulatory Visit
Admission: RE | Admit: 2021-02-11 | Discharge: 2021-02-11 | Disposition: A | Discharge status: Home / Self Care | Payer: Medicare Other | Source: Ambulatory Visit | Attending: Radiation Oncology | Admitting: Radiation Oncology

## 2021-02-11 ENCOUNTER — Ambulatory Visit (HOSPITAL_COMMUNITY)
Admission: RE | Admit: 2021-02-11 | Discharge: 2021-02-11 | Disposition: A | Discharge status: Home / Self Care | Payer: Medicare Other | Source: Ambulatory Visit | Attending: Urology | Admitting: Urology

## 2021-02-11 ENCOUNTER — Ambulatory Visit
Admission: RE | Admit: 2021-02-11 | Discharge: 2021-02-11 | Disposition: A | Discharge status: Home / Self Care | Payer: Medicare Other | Source: Ambulatory Visit | Attending: Urology | Admitting: Urology

## 2021-02-11 ENCOUNTER — Other Ambulatory Visit: Payer: Self-pay

## 2021-02-11 ENCOUNTER — Encounter (HOSPITAL_COMMUNITY)
Admission: RE | Admit: 2021-02-11 | Discharge: 2021-02-11 | Disposition: A | Discharge status: Home / Self Care | Payer: Medicare Other | Source: Ambulatory Visit | Attending: Urology | Admitting: Urology

## 2021-02-11 DIAGNOSIS — Z01818 Encounter for other preprocedural examination: Secondary | ICD-10-CM | POA: Insufficient documentation

## 2021-02-11 DIAGNOSIS — C61 Malignant neoplasm of prostate: Secondary | ICD-10-CM | POA: Insufficient documentation

## 2021-02-11 NOTE — Progress Notes (Signed)
  Radiation Oncology         [phone]) [phone] ________________________________  Name: Casey Wilcox MRN: [id number]  Date: 02/11/2021  DOB: 1956/09/13  SIMULATION AND TREATMENT PLANNING NOTE PUBIC ARCH STUDY  ZE:[id number], [name], MD  Franchot Gallo, MD  DIAGNOSIS:   65 y.o. gentleman with Stage T1c adenocarcinoma of the prostate with Gleason score of 3+4, and PSA of 3.82. Oncology History  Malignant neoplasm of prostate ([place])  09/07/2020 Cancer Staging   Staging form: Prostate, AJCC 8th Edition - Clinical stage from 09/07/2020: Stage IIB (cT1c, cN0, cM0, PSA: 3.8, Grade Group: 2) - Signed by Freeman Caldron, PA-C on 12/29/2020 Histopathologic type: Adenocarcinoma, NOS Stage prefix: Initial diagnosis Prostate specific antigen (PSA) range: Less than 10 Gleason primary pattern: 3 Gleason secondary pattern: 4 Gleason score: 7 Histologic grading system: 5 grade system Number of biopsy cores examined: 12 Number of biopsy cores positive: 3 Location of positive needle core biopsies: Both sides   12/29/2020 Initial Diagnosis   Malignant neoplasm of prostate ([place])       ICD-10-CM   1. Malignant neoplasm of prostate ([place])  C61     COMPLEX SIMULATION:  The patient presented today for evaluation for possible prostate seed implant. He was brought to the radiation planning suite and placed supine on the CT couch. A 3-dimensional image study set was obtained in upload to the planning computer. There, on each axial slice, I contoured the prostate gland. Then, using three-dimensional radiation planning tools I reconstructed the prostate in view of the structures from the transperineal needle pathway to assess for possible pubic arch interference. In doing so, I did not appreciate any pubic arch interference. Also, the patient's prostate volume was estimated based on the drawn structure. The volume was 49 cc.  Given the pubic arch appearance and prostate volume, patient remains a good candidate to  proceed with prostate seed implant. Today, he freely provided informed written consent to proceed.    PLAN: The patient will undergo prostate seed implant.   ________________________________  Sheral Apley. Tammi Klippel, M.D.

## 2021-04-08 ENCOUNTER — Telehealth: Payer: Self-pay | Admitting: *Deleted

## 2021-04-08 NOTE — Telephone Encounter (Signed)
CALLED PATIENT TO REMIND OF LAB APPT. FOR 04-20-21, SPOKE WITH PATIENT AND HE IS AWARE OF THIS APPT.

## 2021-04-15 ENCOUNTER — Encounter (HOSPITAL_BASED_OUTPATIENT_CLINIC_OR_DEPARTMENT_OTHER): Payer: Self-pay | Admitting: Urology

## 2021-04-16 ENCOUNTER — Other Ambulatory Visit: Payer: Self-pay

## 2021-04-16 ENCOUNTER — Encounter (HOSPITAL_BASED_OUTPATIENT_CLINIC_OR_DEPARTMENT_OTHER): Payer: Self-pay | Admitting: Urology

## 2021-04-16 DIAGNOSIS — C61 Malignant neoplasm of prostate: Secondary | ICD-10-CM

## 2021-04-16 DIAGNOSIS — R2689 Other abnormalities of gait and mobility: Secondary | ICD-10-CM

## 2021-04-16 DIAGNOSIS — K429 Umbilical hernia without obstruction or gangrene: Secondary | ICD-10-CM

## 2021-04-16 DIAGNOSIS — Z973 Presence of spectacles and contact lenses: Secondary | ICD-10-CM

## 2021-04-16 DIAGNOSIS — H9190 Unspecified hearing loss, unspecified ear: Secondary | ICD-10-CM

## 2021-04-16 DIAGNOSIS — E119 Type 2 diabetes mellitus without complications: Secondary | ICD-10-CM

## 2021-04-16 DIAGNOSIS — M1990 Unspecified osteoarthritis, unspecified site: Secondary | ICD-10-CM

## 2021-04-16 HISTORY — DX: Malignant neoplasm of prostate: C61

## 2021-04-16 HISTORY — DX: Unspecified osteoarthritis, unspecified site: M19.90

## 2021-04-16 HISTORY — DX: Presence of spectacles and contact lenses: Z97.3

## 2021-04-16 HISTORY — DX: Type 2 diabetes mellitus without complications: E11.9

## 2021-04-16 HISTORY — DX: Unspecified hearing loss, unspecified ear: H91.90

## 2021-04-16 HISTORY — DX: Umbilical hernia without obstruction or gangrene: K42.9

## 2021-04-16 HISTORY — DX: Other abnormalities of gait and mobility: R26.89

## 2021-04-16 NOTE — Progress Notes (Addendum)
Spoke w/ via phone for pre-op interview---pt Lab needs dos---- none              Lab results------has lab appt 04-20-2021 845 am for cbc cmp pt ptt COVID test -----patient states asymptomatic no test needed Arrive at -------530 am NPO after MN NO Solid Food.  Clear liquids from MN until---430 am then npo Med rec completed Medications to take morning of surgery -----atorvastatin Diabetic medication -----diet controlled dm Patient instructed no nail polish to be worn day of surgery Patient instructed to bring photo id and insurance family laura way will stay   for 24 hours after surgery  Patient Special Instructions -----fleets enema am of surgery Pre-Op special Istructions -----none Patient verbalized understanding of instructions that were given at this phone interview. Patient denies shortness of breath, chest pain, fever, cough at this phone interview.   Chest xray 02-11-2021 chart/epic Ekg 02-11-2021 chart/ epic

## 2021-04-20 ENCOUNTER — Other Ambulatory Visit: Payer: Self-pay

## 2021-04-20 ENCOUNTER — Encounter (HOSPITAL_COMMUNITY)
Admission: RE | Admit: 2021-04-20 | Discharge: 2021-04-20 | Disposition: A | Discharge status: Home / Self Care | Payer: Medicare Other | Source: Ambulatory Visit | Attending: Urology | Admitting: Urology

## 2021-04-20 DIAGNOSIS — Z01812 Encounter for preprocedural laboratory examination: Secondary | ICD-10-CM | POA: Diagnosis present

## 2021-04-20 LAB — CBC
HCT: 46.2 % (ref 39.0–52.0)
Hemoglobin: 15.8 g/dL (ref 13.0–17.0)
MCH: 27.6 pg (ref 26.0–34.0)
MCHC: 34.2 g/dL (ref 30.0–36.0)
MCV: 80.8 fL (ref 80.0–100.0)
Platelets: 152 10*3/uL (ref 150–400)
RBC: 5.72 MIL/uL (ref 4.22–5.81)
RDW: 13.2 % (ref 11.5–15.5)
WBC: 6.9 10*3/uL (ref 4.0–10.5)
nRBC: 0 % (ref 0.0–0.2)

## 2021-04-20 LAB — COMPREHENSIVE METABOLIC PANEL
ALT: 26 U/L (ref 0–44)
AST: 20 U/L (ref 15–41)
Albumin: 4.1 g/dL (ref 3.5–5.0)
Alkaline Phosphatase: 86 U/L (ref 38–126)
Anion gap: 7 (ref 5–15)
BUN: 19 mg/dL (ref 8–23)
CO2: 26 mmol/L (ref 22–32)
Calcium: 9.7 mg/dL (ref 8.9–10.3)
Chloride: 105 mmol/L (ref 98–111)
Creatinine, Ser: 0.9 mg/dL (ref 0.61–1.24)
GFR, Estimated: >60 mL/min (ref >60)
Glucose, Bld: 265 mg/dL — ABNORMAL HIGH (ref 70–99)
Potassium: 3.9 mmol/L (ref 3.5–5.1)
Sodium: 138 mmol/L (ref 135–145)
Total Bilirubin: 0.8 mg/dL (ref 0.3–1.2)
Total Protein: 6.9 g/dL (ref 6.5–8.1)

## 2021-04-20 LAB — PROTIME-INR
INR: 1 (ref 0.8–1.2)
Prothrombin Time: 13.3 seconds (ref 11.4–15.2)

## 2021-04-20 LAB — APTT: aPTT: 32 seconds (ref 24–36)

## 2021-04-21 ENCOUNTER — Telehealth: Payer: Self-pay | Admitting: *Deleted

## 2021-04-21 NOTE — H&P (Signed)
H&P  Chief Complaint:  prostate cancer  History of Present Illness:  65 year old male presents at this time for brachytherapy and placement of Space OAR for favorable intermediate risk prostate cancer.  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/auditory canals mri care everywhere   Prostate cancer ([place]) 28/78/6767   Umbilical hernia 20/94/7096   asymptomatic   Wears glasses 04/16/2021    Past Surgical History:  Procedure Laterality Date   COLONOSCOPY WITH PROPOFOL N/A 12/20/2016   Procedure: COLONOSCOPY WITH PROPOFOL;  Surgeon: Garlan Fair, MD;  Location: [facility];  Service: Endoscopy;  Laterality: N/A;   FOOT SURGERY Bilateral    HERNIA REPAIR     inguinal    Home Medications:  Allergies as of 04/21/2021   No Known Allergies      Medication List      Notice   Cannot display discharge medications because the patient has not yet been admitted.     Allergies: No Known Allergies  Family History  Problem Relation Age of Onset   Colon cancer Mother    Colon cancer Father    Prostate cancer Paternal Uncle    Head & neck cancer Paternal Uncle    Breast cancer Neg Hx    Pancreatic cancer Neg Hx     Social History:  reports that he quit smoking about 45 years ago. His smoking use included cigarettes. He has a 6.00 pack-year smoking history. He has never used smokeless tobacco. He reports current alcohol use. He reports that he does not use drugs.  ROS: A complete review of systems was performed.  All systems are negative except for pertinent findings as noted.  Physical Exam:  Vital signs in last 24 hours: Ht 5\' 6"  (1.676 m)    Wt 66.2 kg   BMI 23.57 kg/m  Constitutional:  Alert and oriented, No acute distress Cardiovascular: Regular rate  Respiratory: Normal respiratory effort GI: Abdomen is soft, nontender, nondistended, no abdominal masses. No CVAT.  Genitourinary: Normal male phallus, testes are descended bilaterally and non-tender and without masses, scrotum is normal in appearance without lesions or masses, perineum is normal on inspection. Lymphatic: No lymphadenopathy Neurologic: Grossly intact, no focal deficits Psychiatric: Normal mood and affect  Laboratory Data:  Recent Labs    04/20/21 0903  WBC 6.9  HGB 15.8  HCT 46.2  PLT 152    Recent Labs    04/20/21 0903  NA 138  K 3.9  CL 105  GLUCOSE 265*  BUN 19  CALCIUM 9.7  CREATININE 0.90     No results found for this or any previous visit (from the past 24 hour(s)). No results found for this or any previous visit (from the past 240 hour(s)).  Renal Function: Recent Labs    04/20/21 0903  CREATININE 0.90   Estimated Creatinine Clearance: 73.8 mL/min (by C-G formula based on SCr of 0.9 mg/dL).  Radiologic Imaging: No results found.  Impression/Assessment:    Adenocarcinoma the prostate, favorable intermediate risk  Plan:   I 125 brachytherapy, placement of Space OAR

## 2021-04-21 NOTE — Telephone Encounter (Signed)
Called patient to remind of procedure for 04-22-21, spoke with patient and he is aware of this procedure

## 2021-04-21 NOTE — Anesthesia Preprocedure Evaluation (Addendum)
Anesthesia Evaluation  Patient identified by MRN, date of birth, ID band Patient awake    Reviewed: Allergy & Precautions, NPO status , Patient's Chart, lab work & pertinent test results  Airway Mallampati: II  TM Distance: >3 FB Neck ROM: Full    Dental no notable dental hx.    Pulmonary neg pulmonary ROS, former smoker,    Pulmonary exam normal breath sounds clear to auscultation       Cardiovascular negative cardio ROS Normal cardiovascular exam Rhythm:Regular Rate:Normal     Neuro/Psych negative neurological ROS  negative psych ROS   GI/Hepatic negative GI ROS, Neg liver ROS,   Endo/Other  diabetes, Type 2  Renal/GU negative Renal ROS  negative genitourinary   Musculoskeletal negative musculoskeletal ROS (+)   Abdominal   Peds negative pediatric ROS (+)  Hematology negative hematology ROS (+)   Anesthesia Other Findings   Reproductive/Obstetrics negative OB ROS                            Anesthesia Physical Anesthesia Plan  ASA: 2  Anesthesia Plan: General   Post-op Pain Management:    Induction: Intravenous  PONV Risk Score and Plan: 2 and Ondansetron, Dexamethasone and Treatment may vary due to age or medical condition  Airway Management Planned: LMA  Additional Equipment:   Intra-op Plan:   Post-operative Plan: Extubation in OR  Informed Consent: I have reviewed the patients History and Physical, chart, labs and discussed the procedure including the risks, benefits and alternatives for the proposed anesthesia with the patient or authorized representative who has indicated his/her understanding and acceptance.     Dental advisory given  Plan Discussed with: CRNA and Surgeon  Anesthesia Plan Comments:         Anesthesia Quick Evaluation

## 2021-04-22 ENCOUNTER — Ambulatory Visit (HOSPITAL_BASED_OUTPATIENT_CLINIC_OR_DEPARTMENT_OTHER): Payer: Medicare Other | Admitting: Anesthesiology

## 2021-04-22 ENCOUNTER — Encounter (HOSPITAL_BASED_OUTPATIENT_CLINIC_OR_DEPARTMENT_OTHER): Payer: Self-pay | Admitting: Urology

## 2021-04-22 ENCOUNTER — Encounter (HOSPITAL_BASED_OUTPATIENT_CLINIC_OR_DEPARTMENT_OTHER)
Admission: RE | Disposition: A | Discharge status: Home / Self Care | Payer: Self-pay | Source: Ambulatory Visit | Attending: Urology

## 2021-04-22 ENCOUNTER — Ambulatory Visit (HOSPITAL_COMMUNITY): Payer: Medicare Other

## 2021-04-22 ENCOUNTER — Ambulatory Visit (HOSPITAL_BASED_OUTPATIENT_CLINIC_OR_DEPARTMENT_OTHER)
Admission: RE | Admit: 2021-04-22 | Discharge: 2021-04-22 | Disposition: A | Discharge status: Home / Self Care | Payer: Medicare Other | Source: Ambulatory Visit | Attending: Urology | Admitting: Urology

## 2021-04-22 DIAGNOSIS — C61 Malignant neoplasm of prostate: Secondary | ICD-10-CM | POA: Insufficient documentation

## 2021-04-22 DIAGNOSIS — E119 Type 2 diabetes mellitus without complications: Secondary | ICD-10-CM | POA: Insufficient documentation

## 2021-04-22 DIAGNOSIS — Z87891 Personal history of nicotine dependence: Secondary | ICD-10-CM | POA: Diagnosis not present

## 2021-04-22 DIAGNOSIS — Z8616 Personal history of COVID-19: Secondary | ICD-10-CM | POA: Diagnosis not present

## 2021-04-22 HISTORY — PX: CYSTOSCOPY: SHX5120

## 2021-04-22 HISTORY — PX: SPACE OAR INSTILLATION: SHX6769

## 2021-04-22 HISTORY — PX: RADIOACTIVE SEED IMPLANT: SHX5150

## 2021-04-22 LAB — GLUCOSE, CAPILLARY
Glucose-Capillary: 170 mg/dL — ABNORMAL HIGH (ref 70–99)
Glucose-Capillary: 187 mg/dL — ABNORMAL HIGH (ref 70–99)

## 2021-04-22 SURGERY — INSERTION, RADIATION SOURCE, PROSTATE
Anesthesia: General | Site: Rectum

## 2021-04-22 MED ORDER — GLYCOPYRROLATE 0.2 MG/ML IJ SOLN
INTRAMUSCULAR | Status: DC | PRN
  Administered 2021-04-22: .1 mg via INTRAVENOUS

## 2021-04-22 MED ORDER — SODIUM CHLORIDE 0.9 % IV SOLN
2.0000 g | Freq: Once | INTRAVENOUS | Status: AC
  Administered 2021-04-22: 2 g via INTRAVENOUS

## 2021-04-22 MED ORDER — PHENYLEPHRINE HCL (PRESSORS) 10 MG/ML IV SOLN
INTRAVENOUS | Status: DC | PRN
  Administered 2021-04-22 (×6): 80 ug via INTRAVENOUS

## 2021-04-22 MED ORDER — FENTANYL CITRATE (PF) 100 MCG/2ML IJ SOLN
INTRAMUSCULAR | Status: AC
  Filled 2021-04-22: qty 2

## 2021-04-22 MED ORDER — SODIUM CHLORIDE 0.9 % IV SOLN
INTRAVENOUS | Status: AC | PRN
  Administered 2021-04-22: 1000 mL

## 2021-04-22 MED ORDER — LACTATED RINGERS IV SOLN: INTRAVENOUS | Status: DC

## 2021-04-22 MED ORDER — LIDOCAINE HCL (PF) 2 % IJ SOLN
INTRAMUSCULAR | Status: AC
  Filled 2021-04-22: qty 5

## 2021-04-22 MED ORDER — IOHEXOL 300 MG/ML  SOLN
INTRAMUSCULAR | Status: DC | PRN
  Administered 2021-04-22: 7 mL

## 2021-04-22 MED ORDER — SODIUM CHLORIDE (PF) 0.9 % IJ SOLN
INTRAMUSCULAR | Status: DC | PRN
  Administered 2021-04-22: 10 mL

## 2021-04-22 MED ORDER — PROPOFOL 10 MG/ML IV BOLUS
INTRAVENOUS | Status: DC | PRN
  Administered 2021-04-22: 200 mg via INTRAVENOUS

## 2021-04-22 MED ORDER — FLEET ENEMA 7-19 GM/118ML RE ENEM: 1.0000 | ENEMA | Freq: Once | RECTAL | Status: DC

## 2021-04-22 MED ORDER — KETOROLAC TROMETHAMINE 30 MG/ML IJ SOLN: 30.0000 mg | Freq: Once | INTRAMUSCULAR | Status: DC | PRN

## 2021-04-22 MED ORDER — ONDANSETRON HCL 4 MG/2ML IJ SOLN
INTRAMUSCULAR | Status: DC | PRN
  Administered 2021-04-22: 4 mg via INTRAVENOUS

## 2021-04-22 MED ORDER — FENTANYL CITRATE (PF) 100 MCG/2ML IJ SOLN
INTRAMUSCULAR | Status: DC | PRN
  Administered 2021-04-22 (×2): 25 ug via INTRAVENOUS
  Administered 2021-04-22: 50 ug via INTRAVENOUS

## 2021-04-22 MED ORDER — FENTANYL CITRATE (PF) 100 MCG/2ML IJ SOLN
25.0000 ug | INTRAMUSCULAR | Status: DC | PRN
  Administered 2021-04-22 (×3): 25 ug via INTRAVENOUS

## 2021-04-22 MED ORDER — SODIUM CHLORIDE 0.9 % IV SOLN
INTRAVENOUS | Status: AC
  Filled 2021-04-22: qty 2

## 2021-04-22 MED ORDER — DEXAMETHASONE SODIUM PHOSPHATE 10 MG/ML IJ SOLN
INTRAMUSCULAR | Status: AC
  Filled 2021-04-22: qty 1

## 2021-04-22 MED ORDER — STERILE WATER FOR IRRIGATION IR SOLN
Status: DC | PRN
  Administered 2021-04-22: 500 mL

## 2021-04-22 MED ORDER — ONDANSETRON HCL 4 MG/2ML IJ SOLN: 4.0000 mg | Freq: Once | INTRAMUSCULAR | Status: DC | PRN

## 2021-04-22 MED ORDER — OXYCODONE HCL 5 MG PO TABS
5.0000 mg | ORAL_TABLET | Freq: Once | ORAL | Status: DC | PRN
Start: 2021-04-22 — End: 2021-04-22

## 2021-04-22 MED ORDER — PROPOFOL 10 MG/ML IV BOLUS
INTRAVENOUS | Status: AC
  Filled 2021-04-22: qty 60

## 2021-04-22 MED ORDER — DEXAMETHASONE SODIUM PHOSPHATE 4 MG/ML IJ SOLN
INTRAMUSCULAR | Status: DC | PRN
Start: 2021-04-22 — End: 2021-04-22
  Administered 2021-04-22: 5 mg via INTRAVENOUS

## 2021-04-22 MED ORDER — ONDANSETRON HCL 4 MG/2ML IJ SOLN
INTRAMUSCULAR | Status: AC
  Filled 2021-04-22: qty 2

## 2021-04-22 MED ORDER — OXYCODONE HCL 5 MG/5ML PO SOLN: 5.0000 mg | Freq: Once | ORAL | Status: DC | PRN

## 2021-04-22 MED ORDER — LIDOCAINE HCL (CARDIAC) PF 100 MG/5ML IV SOSY
PREFILLED_SYRINGE | INTRAVENOUS | Status: DC | PRN
  Administered 2021-04-22: 100 mg via INTRAVENOUS

## 2021-04-22 SURGICAL SUPPLY — 39 items
BAG DRN RND TRDRP ANRFLXCHMBR (UROLOGICAL SUPPLIES) ×3
BAG URINE DRAIN 2000ML AR STRL (UROLOGICAL SUPPLIES) ×4 IMPLANT
BLADE CLIPPER SENSICLIP SURGIC (BLADE) ×4 IMPLANT
CATH FOLEY 2WAY SLVR  5CC 16FR (CATHETERS) ×4
CATH FOLEY 2WAY SLVR 5CC 16FR (CATHETERS) ×3 IMPLANT
CATH ROBINSON RED A/P 16FR (CATHETERS) IMPLANT
CATH ROBINSON RED A/P 20FR (CATHETERS) ×4 IMPLANT
CLOTH BEACON ORANGE TIMEOUT ST (SAFETY) ×4 IMPLANT
CNTNR URN SCR LID CUP LEK RST (MISCELLANEOUS) ×6 IMPLANT
CONT SPEC 4OZ STRL OR WHT (MISCELLANEOUS) ×8
COVER BACK TABLE 60X90IN (DRAPES) ×4 IMPLANT
COVER MAYO STAND STRL (DRAPES) ×4 IMPLANT
DRAPE C-ARM 35X43 STRL (DRAPES) ×3 IMPLANT
DRSG TEGADERM 4X4.75 (GAUZE/BANDAGES/DRESSINGS) ×7 IMPLANT
DRSG TEGADERM 8X12 (GAUZE/BANDAGES/DRESSINGS) ×7 IMPLANT
GAUZE SPONGE 4X4 12PLY STRL (GAUZE/BANDAGES/DRESSINGS) ×1 IMPLANT
GLOVE SURG ENC MOIS LTX SZ6.5 (GLOVE) ×4 IMPLANT
GLOVE SURG ENC MOIS LTX SZ7.5 (GLOVE) IMPLANT
GLOVE SURG ENC MOIS LTX SZ8 (GLOVE) ×8 IMPLANT
GLOVE SURG ORTHO LTX SZ8.5 (GLOVE) ×4 IMPLANT
GLOVE SURG UNDER POLY LF SZ6.5 (GLOVE) ×2 IMPLANT
GLOVE SURG UNDER POLY LF SZ7.5 (GLOVE) ×1 IMPLANT
GOWN STRL REUS W/ TWL LRG LVL3 (GOWN DISPOSABLE) IMPLANT
GOWN STRL REUS W/TWL LRG LVL3 (GOWN DISPOSABLE) ×4
GOWN STRL REUS W/TWL XL LVL3 (GOWN DISPOSABLE) ×4 IMPLANT
HOLDER FOLEY CATH W/STRAP (MISCELLANEOUS) ×3 IMPLANT
I-SEED AGX100 ×69 IMPLANT
IMPL SPACEOAR VUE SYSTEM (Spacer) ×3 IMPLANT
IMPLANT SPACEOAR VUE SYSTEM (Spacer) ×4 IMPLANT
IV NS 1000ML (IV SOLUTION) ×4
IV NS 1000ML BAXH (IV SOLUTION) ×3 IMPLANT
KIT TURNOVER CYSTO (KITS) ×4 IMPLANT
MARKER SKIN DUAL TIP RULER LAB (MISCELLANEOUS) ×4 IMPLANT
PACK CYSTO (CUSTOM PROCEDURE TRAY) ×4 IMPLANT
SUT BONE WAX W31G (SUTURE) IMPLANT
SYR 10ML LL (SYRINGE) ×4 IMPLANT
TOWEL OR 17X26 10 PK STRL BLUE (TOWEL DISPOSABLE) ×4 IMPLANT
UNDERPAD 30X36 HEAVY ABSORB (UNDERPADS AND DIAPERS) ×8 IMPLANT
WATER STERILE IRR 500ML POUR (IV SOLUTION) ×4 IMPLANT

## 2021-04-22 NOTE — Anesthesia Postprocedure Evaluation (Signed)
Anesthesia Post Note  Patient: Casey Wilcox  Procedure(s) Performed: RADIOACTIVE SEED IMPLANT/BRACHYTHERAPY IMPLANT SPACE OAR INSTILLATION (Rectum) CYSTOSCOPY FLEXIBLE (Bladder)     Patient location during evaluation: PACU Anesthesia Type: General Level of consciousness: awake and alert Pain management: pain level controlled Vital Signs Assessment: post-procedure vital signs reviewed and stable Respiratory status: spontaneous breathing, nonlabored ventilation, respiratory function stable and patient connected to nasal cannula oxygen Cardiovascular status: blood pressure returned to baseline and stable Postop Assessment: no apparent nausea or vomiting Anesthetic complications: no   No notable events documented.  Last Vitals:  Vitals:   04/22/21 0916 04/22/21 0930  BP: (!) 151/109 (!) 147/93  Pulse: 86 79  Resp: 15 18  Temp:    SpO2: 96% 95%    Last Pain:  Vitals:   04/22/21 0935  TempSrc:   PainSc: 5                  Dezzie Badilla S

## 2021-04-22 NOTE — Progress Notes (Signed)
  Radiation Oncology         [phone] ________________________________  Name: [name] MRN: [id number]  Date: 04/22/2021  DOB: 1955-10-11       Prostate Seed Implant  [id number], [name], MD  No ref. provider found  DIAGNOSIS: 65 y.o. gentleman with Stage T1c adenocarcinoma of the prostate with Gleason score of 3+4, and PSA of 3.82.  Oncology History  Malignant neoplasm of prostate ([place])  09/07/2020 Cancer Staging   Staging form: Prostate, AJCC 8th Edition - Clinical stage from 09/07/2020: Stage IIB (cT1c, cN0, cM0, PSA: 3.8, Grade Group: 2) - Signed by Freeman Caldron, PA-C on 12/29/2020  Histopathologic type: Adenocarcinoma, NOS  Stage prefix: Initial diagnosis  Prostate specific antigen (PSA) range: Less than 10  Gleason primary pattern: 3  Gleason secondary pattern: 4  Gleason score: 7  Histologic grading system: 5 grade system  Number of biopsy cores examined: 12  Number of biopsy cores positive: 3  Location of positive needle core biopsies: Both sides    12/29/2020 Initial Diagnosis   Malignant neoplasm of prostate ([facility])      PROCEDURE: Insertion of radioactive I-125 seeds into the prostate gland.  RADIATION DOSE: 145 Gy, definitive therapy.  TECHNIQUE: Carla Whilden was brought to the operating room with the urologist. He was placed in the dorsolithotomy position. He was catheterized and a rectal tube was inserted. The perineum was shaved, prepped and draped. The ultrasound probe was then introduced into the rectum to see the prostate gland.  TREATMENT DEVICE: A needle grid was attached to the ultrasound probe stand and anchor needles were placed.  3D PLANNING: The prostate was imaged in 3D using a sagittal sweep of the prostate probe. These images were transferred to the planning computer. There, the prostate, urethra and rectum were defined on each axial reconstructed image. Then, the software created an optimized 3D plan and a few seed positions  were adjusted. The quality of the plan was reviewed using [facility] information for the target and the following two organs at risk:  Urethra and Rectum.  Then the accepted plan was printed and handed off to the radiation therapist.  Under my supervision, the custom loading of the seeds and spacers was carried out and loaded into sealed vicryl sleeves.  These pre-loaded needles were then placed into the needle holder.Marland Kitchen  PROSTATE VOLUME STUDY:  Using transrectal ultrasound the volume of the prostate was verified to be 54.6 cc.  SPECIAL TREATMENT PROCEDURE/SUPERVISION AND HANDLING: The pre-loaded needles were then delivered under sagittal guidance. A total of 18 needles were used to deposit 69 seeds in the prostate gland. The individual seed activity was 0.531 mCi.  SpaceOAR:  Yes  COMPLEX SIMULATION: At the end of the procedure, an anterior radiograph of the pelvis was obtained to document seed positioning and count. Cystoscopy was performed to check the urethra and bladder.  MICRODOSIMETRY: At the end of the procedure, the patient was emitting 0.080 mR/hr at 1 meter. Accordingly, he was considered safe for hospital discharge.  PLAN: The patient will return to the radiation oncology clinic for post implant CT dosimetry in three weeks.   ________________________________  Sheral Apley Tammi Klippel, M.D.

## 2021-04-22 NOTE — Discharge Instructions (Addendum)
Radioactive Seed Implant Home Care Instructions   Activity:    Rest for the remainder of the day.  Do not drive or operate equipment today.  You may resume normal  activities in a few days as instructed by your physician, without risk of harmful radiation exposure to those around you, provided you follow the time and distance precautions on the Radiation Oncology Instruction Sheet.   Meals: Drink plenty of lipuids and eat light foods, such as gelatin or soup this evening .  You may return to normal meal plan tomorrow.  Return To Work: You may return to work as instructed by [employer].  Special Instruction:   If any seeds are found, use tweezers to pick up seeds and place in a glass container of any kind and bring to your physician's office.  Call your physician if any of these symptoms occur:  Persistent or heavy bleeding Urine stream diminishes or stops completely after catheter is removed Fever equal to or greater than 101 degrees F Cloudy urine with a strong foul odor Severe pain  You may feel some burning pain and/or hesitancy when you urinate after the catheter is removed.  These symptoms may increase over the next few weeks, but should diminish within forur to six weeks.  Applying moist heat to the lower abdomen or a hot tub bath may help relieve the pain.  If the discomfort becomes severe, please call your physician for additional medications.      Post Anesthesia Home Care Instructions  Activity: Get plenty of rest for the remainder of the day. A responsible individual must stay with you for 24 hours following the procedure.  For the next 24 hours, DO NOT: -Drive a car -Paediatric nurse -Drink alcoholic beverages -Take any medication unless instructed by your physician -Make any legal decisions or sign important papers.  Meals: Start with liquid foods such as gelatin or soup. Progress to regular foods as tolerated. Avoid greasy, spicy, heavy foods. If nausea  and/or vomiting occur, drink only clear liquids until the nausea and/or vomiting subsides. Call your physician if vomiting continues.  Special Instructions/Symptoms: Your throat may feel dry or sore from the anesthesia or the breathing tube placed in your throat during surgery. If this causes discomfort, gargle with warm salt water. The discomfort should disappear within 24 hours.  If you had a scopolamine patch placed behind your ear for the management of post- operative nausea and/or vomiting:  1. The medication in the patch is effective for 72 hours, after which it should be removed.  Wrap patch in a tissue and discard in the trash. Wash hands thoroughly with soap and water. 2. You may remove the patch earlier than 72 hours if you experience unpleasant side effects which may include dry mouth, dizziness or visual disturbances. 3. Avoid touching the patch. Wash your hands with soap and water after contact with the patch.

## 2021-04-22 NOTE — Transfer of Care (Signed)
Immediate Anesthesia Transfer of Care Note  Patient: Eder Macek  Procedure(s) Performed: RADIOACTIVE SEED IMPLANT/BRACHYTHERAPY IMPLANT SPACE OAR INSTILLATION (Rectum) CYSTOSCOPY FLEXIBLE (Bladder)  Patient Location: PACU  Anesthesia Type:General  Level of Consciousness: awake, alert  and patient cooperative  Airway & Oxygen Therapy: Patient Spontanous Breathing and Patient connected to nasal cannula oxygen  Post-op Assessment: Report given to RN and Post -op Vital signs reviewed and stable  Post vital signs: Reviewed and stable  Last Vitals:  Vitals Value Taken Time  BP 128/87 04/22/21 0851  Temp 36.5 C 04/22/21 0851  Pulse 83 04/22/21 0855  Resp 14 04/22/21 0855  SpO2 100 % 04/22/21 0855  Vitals shown include unvalidated device data.  Last Pain:  Vitals:   04/22/21 0556  TempSrc: Oral  PainSc: 0-No pain      Patients Stated Pain Goal: 5 (41/66/06 3016)  Complications: No notable events documented.

## 2021-04-22 NOTE — Anesthesia Procedure Notes (Signed)
Procedure Name: LMA Insertion Date/Time: 04/22/2021 7:43 AM Performed by: Georgeanne Nim, CRNA Pre-anesthesia Checklist: Patient identified, Emergency Drugs available, Suction available, Patient being monitored and Timeout performed Patient Re-evaluated:Patient Re-evaluated prior to induction Oxygen Delivery Method: Circle system utilized Preoxygenation: Pre-oxygenation with 100% oxygen Induction Type: IV induction Ventilation: Mask ventilation without difficulty LMA: LMA inserted LMA Size: 4.0 Laser Tube: Cuffed inflated with minimal occlusive pressure - saline Placement Confirmation: positive ETCO2, CO2 detector and breath sounds checked- equal and bilateral

## 2021-04-22 NOTE — Interval H&P Note (Signed)
History and Physical Interval Note:  04/22/2021 7:29 AM  Casey Wilcox  has presented today for surgery, with the diagnosis of PROSTATE CANCER.  The various methods of treatment have been discussed with the patient and family. After consideration of risks, benefits and other options for treatment, the patient has consented to  Procedure(s) with comments: RADIOACTIVE SEED IMPLANT/BRACHYTHERAPY IMPLANT (N/A) -   seeds implanted SPACE OAR INSTILLATION (N/A) CYSTOSCOPY FLEXIBLE as a surgical intervention.  The patient's history has been reviewed, patient examined, no change in status, stable for surgery.  I have reviewed the patient's chart and labs.  Questions were answered to the patient's satisfaction.     Lillette Boxer Cohan Stipes

## 2021-04-22 NOTE — Op Note (Signed)
Preoperative diagnosis: Clinical stage TI C adenocarcinoma the prostate   Postoperative diagnosis: Same   Procedure: I-125 prostate seed implantation, SpaceOAR placement, flexible cystoscopy  Surgeon: Lillette Boxer. Trinton Prewitt M.D.  Radiation Oncologist: Tyler Pita, M.D.  Anesthesia: Gen.   Indications: Patient  was diagnosed with clinical stage TI C prostate cancer. We had extensive discussion with him about treatment options versus. He elected to proceed with seed implantation. He underwent consultation my office as well as with Dr. Tammi Klippel. He appeared to understand the advantages disadvantages potential risks of this treatment option. Full informed consent has been obtained.   Technique and findings: Patient was brought the operating room where he had successful induction of general anesthesia. He was placed in dorso-lithotomy position and prepped and draped in usual manner. Appropriate surgical timeout was performed. Radiation oncology department placed a transrectal ultrasound probe anchoring stand. Foley catheter with contrast in the balloon was inserted without difficulty. Anchoring needles were placed within the prostate. Rectal tube was placed. Real-time contouring of the urethra prostate and rectum were performed and the dosing parameters were established. Targeted dose was 145 gray.  I was then called  to the operating suite suite for placement of the needles. A second timeout was performed. All needle passage was done with real-time transrectal ultrasound guidance with the sagittal plane. A total of 18 needles were placed.  69 active seeds were implanted.  I then proceeded with placement of SpaceOAR by introducing a needle with the bevel angled inferiorly approximately 2 cm superior to the anus. This was angled downward and under direct ultrasound was placed within the space between the prostatic capsule and rectum. This was confirmed with a small amount of sterile saline injected and  this was performed under direct ultrasound. I then attached the SpaceOAR to the needle and injected this in the space between the prostate and rectum with good placement noted. The Foley catheter was removed and flexible cystoscopy failed to show any seeds outside the prostate.  The patient was brought to recovery room in stable condition, having tolerated the procedure well.Marland Kitchen

## 2021-04-23 ENCOUNTER — Encounter (HOSPITAL_BASED_OUTPATIENT_CLINIC_OR_DEPARTMENT_OTHER): Payer: Self-pay | Admitting: Urology

## 2021-05-10 ENCOUNTER — Telehealth: Payer: Self-pay | Admitting: *Deleted

## 2021-05-10 NOTE — Telephone Encounter (Signed)
Called patient to remind of post seed appts. for 05-11-21, spoke with patient and he is aware of these appts.

## 2021-05-11 ENCOUNTER — Ambulatory Visit
Admission: RE | Admit: 2021-05-11 | Discharge: 2021-05-11 | Disposition: A | Discharge status: Home / Self Care | Payer: Medicare Other | Source: Ambulatory Visit | Attending: Radiation Oncology | Admitting: Radiation Oncology

## 2021-05-11 ENCOUNTER — Other Ambulatory Visit: Payer: Self-pay | Admitting: Urology

## 2021-05-11 ENCOUNTER — Ambulatory Visit
Admission: RE | Admit: 2021-05-11 | Discharge: 2021-05-11 | Disposition: A | Discharge status: Home / Self Care | Payer: Medicare Other | Source: Ambulatory Visit | Attending: Urology | Admitting: Urology

## 2021-05-11 ENCOUNTER — Encounter: Payer: Self-pay | Admitting: Urology

## 2021-05-11 ENCOUNTER — Other Ambulatory Visit: Payer: Self-pay

## 2021-05-11 DIAGNOSIS — Z79899 Other long term (current) drug therapy: Secondary | ICD-10-CM | POA: Insufficient documentation

## 2021-05-11 DIAGNOSIS — C61 Malignant neoplasm of prostate: Secondary | ICD-10-CM | POA: Insufficient documentation

## 2021-05-11 DIAGNOSIS — R350 Frequency of micturition: Secondary | ICD-10-CM | POA: Insufficient documentation

## 2021-05-11 DIAGNOSIS — R3911 Hesitancy of micturition: Secondary | ICD-10-CM | POA: Insufficient documentation

## 2021-05-11 NOTE — Progress Notes (Signed)
Radiation Oncology         ([phone]) [phone] ________________________________  Name: Casey Wilcox MRN: [id number]  Date: 05/11/2021  DOB: [birth year]  Post-Seed Follow-Up Visit Note  CC: Lavone Orn, MD  Franchot Gallo, MD  Diagnosis:   65 y.o. gentleman with Stage T1c adenocarcinoma of the prostate with Gleason score of 3+4, and PSA of 3.82.    ICD-10-CM   1. Malignant neoplasm of prostate (HCC)  C61       Interval Since Last Radiation:  2.5 weeks 04/22/21:  Insertion of radioactive I-125 seeds into the prostate gland; 145 Gy, definitive therapy with placement of SpaceOAR gel.  Narrative:  The patient returns today for routine follow-up.  He is complaining of increased urinary frequency and urinary hesitation symptoms. He filled out a questionnaire regarding urinary function today providing and overall IPSS score of 28 characterizing his symptoms as severe with nocturia x4, increased frequency, urgency, intermittency, hesitancy, weak stream and straining despite taking Flomax daily as prescribed.  He also reports some dysuria.  His pre-implant score was 18. He denies any abdominal pain or bowel symptoms.  He has a good appetite and is maintaining his weight. Overall, he is pleased with his progress to date.  ALLERGIES:  has No Known Allergies.  Meds: Current Outpatient Medications  Medication Sig Dispense Refill   atorvastatin (LIPITOR) 40 MG tablet Take 40 mg by mouth daily.     ibuprofen (ADVIL) 200 MG tablet 3 tablet as needed     No current facility-administered medications for this encounter.    Physical Findings: In general this is a well appearing Caucasian male in no acute distress. He's alert and oriented x4 and appropriate throughout the examination. Cardiopulmonary assessment is negative for acute distress and he exhibits normal effort.   Lab Findings: Lab Results  Component Value Date   WBC 6.9 04/20/2021   HGB 15.8 04/20/2021   HCT 46.2 04/20/2021   MCV 80.8  04/20/2021   PLT 152 04/20/2021    Radiographic Findings:  Patient underwent CT imaging in our clinic for post implant dosimetry. The CT will be reviewed by Dr. Tammi Klippel to confirm there is an adequate distribution of radioactive seeds throughout the prostate gland and ensure that there are no seeds in or near the rectum.  We suspect the final radiation plan and dosimetry will show appropriate coverage of the prostate gland. He understands that we will call and inform him of any unexpected findings on further review of his imaging and dosimetry.  Impression/Plan: 65 y.o. gentleman with Stage T1c adenocarcinoma of the prostate with Gleason score of 3+4, and PSA of 3.82. The patient is recovering from the effects of radiation. His urinary symptoms should gradually improve over the next 4-6 months. We talked about this today. He saw Dr. Jeffie Pollock on 04/28/21 and is taking Flomax and ibuprofen as prescribed but continues with bothersome LUTS so we will try adding AZO to see if we can get some additional relief of the dysuria and irritative voiding symptoms.  He is otherwise pleased with his outcome. We also talked about long-term follow-up for prostate cancer following seed implant. He understands that ongoing PSA determinations and digital rectal exams will help perform surveillance to rule out disease recurrence. He has a follow up appointment scheduled with Jiles Crocker, NP on 05/13/21 and will see Dr. Diona Fanti in follow up with PSA in October 2022. He understands what to expect with his PSA measures. Patient was also educated today about some of the long-term  effects from radiation including a small risk for rectal bleeding and possibly erectile dysfunction. We talked about some of the general management approaches to these potential complications. However, I did encourage the patient to contact our office or return at any point if he has questions or concerns related to his previous radiation and prostate  cancer.    Nicholos Johns, PA-C

## 2021-05-11 NOTE — Progress Notes (Signed)
Patient reports incomplete bladder emptying always, with frequency, intermittency, weak stream, straining and nocturia most of the time with some urgency. Patient reports urology follow up appointment this Thursday 05/13/21. IPSS score is 28.

## 2021-05-11 NOTE — Progress Notes (Signed)
  Radiation Oncology         [phone]) [phone] ________________________________  Name: Casey Wilcox MRN: [id number]  Date: 05/11/2021  DOB: 09/15/1956  COMPLEX SIMULATION NOTE  NARRATIVE:  The patient was brought to the [facility] suite today following prostate seed implantation approximately one month ago.  Identity was confirmed.  All relevant records and images related to the planned course of therapy were reviewed.  Then, the patient was set-up supine.  CT images were obtained.  The CT images were loaded into the planning software.  Then the prostate and rectum were contoured.  Treatment planning then occurred.  The implanted iodine 125 seeds were identified by the physics staff for projection of radiation distribution  I have requested : 3D Simulation  I have requested a DVH of the following structures: Prostate and rectum.    ________________________________  Sheral Apley Tammi Klippel, M.D.

## 2021-05-23 ENCOUNTER — Emergency Department (HOSPITAL_BASED_OUTPATIENT_CLINIC_OR_DEPARTMENT_OTHER): Payer: Medicare Other

## 2021-05-23 ENCOUNTER — Encounter (HOSPITAL_BASED_OUTPATIENT_CLINIC_OR_DEPARTMENT_OTHER): Payer: Self-pay | Admitting: Obstetrics and Gynecology

## 2021-05-23 ENCOUNTER — Other Ambulatory Visit: Payer: Self-pay

## 2021-05-23 ENCOUNTER — Emergency Department (HOSPITAL_BASED_OUTPATIENT_CLINIC_OR_DEPARTMENT_OTHER)
Admission: EM | Admit: 2021-05-23 | Discharge: 2021-05-24 | Disposition: A | Discharge status: Home / Self Care | Payer: Medicare Other | Source: Home / Self Care | Attending: Emergency Medicine | Admitting: Emergency Medicine

## 2021-05-23 DIAGNOSIS — K6289 Other specified diseases of anus and rectum: Secondary | ICD-10-CM | POA: Diagnosis not present

## 2021-05-23 DIAGNOSIS — Z87891 Personal history of nicotine dependence: Secondary | ICD-10-CM | POA: Insufficient documentation

## 2021-05-23 DIAGNOSIS — C61 Malignant neoplasm of prostate: Secondary | ICD-10-CM | POA: Insufficient documentation

## 2021-05-23 DIAGNOSIS — E119 Type 2 diabetes mellitus without complications: Secondary | ICD-10-CM | POA: Insufficient documentation

## 2021-05-23 DIAGNOSIS — N390 Urinary tract infection, site not specified: Secondary | ICD-10-CM | POA: Diagnosis not present

## 2021-05-23 DIAGNOSIS — Z20822 Contact with and (suspected) exposure to covid-19: Secondary | ICD-10-CM | POA: Insufficient documentation

## 2021-05-23 DIAGNOSIS — E860 Dehydration: Secondary | ICD-10-CM | POA: Insufficient documentation

## 2021-05-23 LAB — LACTIC ACID, PLASMA: Lactic Acid, Venous: 0.9 mmol/L (ref 0.5–1.9)

## 2021-05-23 LAB — CBC WITH DIFFERENTIAL/PLATELET
Abs Immature Granulocytes: 0.1 10*3/uL — ABNORMAL HIGH (ref 0.00–0.07)
Basophils Absolute: 0 10*3/uL (ref 0.0–0.1)
Basophils Relative: 0 %
Eosinophils Absolute: 0 10*3/uL (ref 0.0–0.5)
Eosinophils Relative: 0 %
HCT: 35.7 % — ABNORMAL LOW (ref 39.0–52.0)
Hemoglobin: 12.4 g/dL — ABNORMAL LOW (ref 13.0–17.0)
Immature Granulocytes: 1 %
Lymphocytes Relative: 6 %
Lymphs Abs: 0.9 10*3/uL (ref 0.7–4.0)
MCH: 26.8 pg (ref 26.0–34.0)
MCHC: 34.7 g/dL (ref 30.0–36.0)
MCV: 77.1 fL — ABNORMAL LOW (ref 80.0–100.0)
Monocytes Absolute: 0.8 10*3/uL (ref 0.1–1.0)
Monocytes Relative: 6 %
Neutro Abs: 11.9 10*3/uL — ABNORMAL HIGH (ref 1.7–7.7)
Neutrophils Relative %: 87 %
Platelets: 96 10*3/uL — ABNORMAL LOW (ref 150–400)
RBC: 4.63 MIL/uL (ref 4.22–5.81)
RDW: 13.1 % (ref 11.5–15.5)
WBC: 13.8 10*3/uL — ABNORMAL HIGH (ref 4.0–10.5)
nRBC: 0 % (ref 0.0–0.2)

## 2021-05-23 LAB — RESP PANEL BY RT-PCR (FLU A&B, COVID) ARPGX2
Influenza A by PCR: NEGATIVE
Influenza B by PCR: NEGATIVE
SARS Coronavirus 2 by RT PCR: NEGATIVE

## 2021-05-23 LAB — URINALYSIS, ROUTINE W REFLEX MICROSCOPIC
Bilirubin Urine: NEGATIVE
Glucose, UA: NEGATIVE mg/dL
Hgb urine dipstick: NEGATIVE
Ketones, ur: NEGATIVE mg/dL
Leukocytes,Ua: NEGATIVE
Nitrite: NEGATIVE
Specific Gravity, Urine: 1.007 (ref 1.005–1.030)
pH: 5.5 (ref 5.0–8.0)

## 2021-05-23 LAB — COMPREHENSIVE METABOLIC PANEL
ALT: 22 U/L (ref 0–44)
AST: 24 U/L (ref 15–41)
Albumin: 3.2 g/dL — ABNORMAL LOW (ref 3.5–5.0)
Alkaline Phosphatase: 141 U/L — ABNORMAL HIGH (ref 38–126)
Anion gap: 10 (ref 5–15)
BUN: 36 mg/dL — ABNORMAL HIGH (ref 8–23)
CO2: 24 mmol/L (ref 22–32)
Calcium: 8.5 mg/dL — ABNORMAL LOW (ref 8.9–10.3)
Chloride: 99 mmol/L (ref 98–111)
Creatinine, Ser: 1.62 mg/dL — ABNORMAL HIGH (ref 0.61–1.24)
GFR, Estimated: 47 mL/min — ABNORMAL LOW (ref >60)
Glucose, Bld: 214 mg/dL — ABNORMAL HIGH (ref 70–99)
Potassium: 3.6 mmol/L (ref 3.5–5.1)
Sodium: 133 mmol/L — ABNORMAL LOW (ref 135–145)
Total Bilirubin: 1.3 mg/dL — ABNORMAL HIGH (ref 0.3–1.2)
Total Protein: 6.1 g/dL — ABNORMAL LOW (ref 6.5–8.1)

## 2021-05-23 MED ORDER — ONDANSETRON 4 MG PO TBDP
4.0000 mg | ORAL_TABLET | Freq: Once | ORAL | Status: AC | PRN
  Administered 2021-05-23: 4 mg via ORAL
  Filled 2021-05-23: qty 1

## 2021-05-23 MED ORDER — IOHEXOL 350 MG/ML SOLN
60.0000 mL | Freq: Once | INTRAVENOUS | Status: AC | PRN
  Administered 2021-05-23: 60 mL via INTRAVENOUS

## 2021-05-23 MED ORDER — SODIUM CHLORIDE 0.9 % IV BOLUS
1000.0000 mL | Freq: Once | INTRAVENOUS | Status: AC
  Administered 2021-05-23: 1000 mL via INTRAVENOUS

## 2021-05-23 MED ORDER — OXYCODONE-ACETAMINOPHEN 5-325 MG PO TABS
1.0000 | ORAL_TABLET | ORAL | Status: AC | PRN
  Administered 2021-05-23 (×2): 1 via ORAL
  Filled 2021-05-23 (×2): qty 1

## 2021-05-23 MED ORDER — SODIUM CHLORIDE 0.9 % IV SOLN: INTRAVENOUS | Status: DC

## 2021-05-23 NOTE — ED Triage Notes (Signed)
Patient reports to the ER for prostate cancer complications. Patient reports he had the seed radiation treatment done for the cancer but he is having increased weakness, lack of CM, lack of appetite, and chills.

## 2021-05-23 NOTE — ED Notes (Signed)
Patient transported to CT 

## 2021-05-24 MED ORDER — ONDANSETRON 4 MG PO TBDP: 4.0000 mg | ORAL_TABLET | Freq: Three times a day (TID) | ORAL | 1 refills | Status: AC | PRN

## 2021-05-24 MED ORDER — HYDROCODONE-ACETAMINOPHEN 5-325 MG PO TABS: 1.0000 | ORAL_TABLET | Freq: Four times a day (QID) | ORAL | 0 refills | Status: AC | PRN

## 2021-05-24 NOTE — ED Provider Notes (Signed)
[facility] EMERGENCY DEPT Provider Note   CSN: [id number] Arrival date & time: 05/23/21  1540     History Chief Complaint  Patient presents with   Prostate Cancer    Casey Wilcox is a 65 y.o. male.  Patient is followed by Dr. Diona Fanti from [facility] urology.  Patient has a history of prostate cancer.  Had seeds placed on July 21.  Since that time patient has not felt well.  Sent complaints of some abdominal pains had some diarrhea has felt like he is constipated.  No chest pain no shortness of breath.  Patient also says lack of appetite and chills.  But no distinct fevers.  Past medical history significant for type 2 diabetes and the prostate cancer.  Also no upper respiratory symptoms.      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   History of adenomatous polyp of colon 12/29/2020   Hypercholesterolemia 12/29/2020   Localized, 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/2020   Malignant neoplasm of prostate ([place]) 12/29/2020   Sleep disturbance 12/29/2020   Squamous cell carcinoma of skin of face 12/29/2020   Type 2 diabetes mellitus without complications ([place]) 11/65/7903   Asymmetrical left sensorineural hearing loss 11/19/2019   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[place]) 11/19/2019    Past Surgical History:  Procedure Laterality Date    COLONOSCOPY WITH PROPOFOL N/A 12/20/2016   Procedure: COLONOSCOPY WITH PROPOFOL;  Surgeon: Garlan Fair, MD;  Location: [facility];  Service: Endoscopy;  Laterality: N/A;   CYSTOSCOPY  04/22/2021   Procedure: CYSTOSCOPY FLEXIBLE;  Surgeon: Franchot Gallo, MD;  Location: [facility];  Service: Urology;;   FOOT SURGERY Bilateral    HERNIA REPAIR     inguinal   RADIOACTIVE SEED IMPLANT N/A 04/22/2021   Procedure: RADIOACTIVE SEED IMPLANT/BRACHYTHERAPY IMPLANT;  Surgeon: Franchot Gallo, MD;  Location: [facility];  Service: Urology;  Laterality: N/A;  69 seeds implanted   SPACE OAR INSTILLATION N/A 04/22/2021   Procedure: SPACE OAR INSTILLATION;  Surgeon: Franchot Gallo, MD;  Location: [facility];  Service: Urology;  Laterality: N/A;       Family History  Problem Relation Age of Onset   Colon cancer Mother    Colon cancer Father    Prostate cancer Paternal Uncle    Head & neck cancer Paternal Uncle    Breast cancer Neg Hx    Pancreatic cancer Neg Hx     Social History   Tobacco Use   Smoking status: Former    Packs/day: 1.00    Years: 6.00    Pack years: 6.00    Types: Cigarettes    Quit date: 10/04/1975    Years since quitting: 45.6   Smokeless tobacco: Never  Vaping Use   Vaping Use: Never used  Substance  Use Topics   Alcohol use: Yes    Comment: daily 1-2   Drug use: No    Home Medications Prior to Admission medications   Medication Sig Start Date End Date Taking? Authorizing Provider  HYDROcodone-acetaminophen (NORCO/VICODIN) 5-325 MG tablet Take 1 tablet by mouth every 6 (six) hours as needed for moderate pain. 05/24/21  Yes Fredia Sorrow, MD  ondansetron (ZOFRAN ODT) 4 MG disintegrating tablet Take 1 tablet (4 mg total) by mouth every 8 (eight) hours as needed for nausea or vomiting. 05/24/21  Yes Fredia Sorrow, MD  atorvastatin (LIPITOR) 40 MG tablet Take 40 mg by mouth daily. 12/09/20   [provider]  ibuprofen (ADVIL) 200 MG tablet 3 tablet as needed    [provider]    Allergies    Patient has no known allergies.  Review of Systems   Review of Systems  Constitutional:  Positive for chills and fatigue. Negative for fever.  HENT:  Negative for ear pain and sore throat.   Eyes:  Negative for pain and visual disturbance.  Respiratory:  Negative for cough and shortness of breath.   Cardiovascular:  Negative for chest pain and palpitations.  Gastrointestinal:  Positive for abdominal pain, constipation, diarrhea and nausea. Negative for vomiting.  Genitourinary:  Negative for dysuria and hematuria.  Musculoskeletal:  Negative for arthralgias and back pain.  Skin:  Negative for color change and rash.  Neurological:  Negative for seizures and syncope.  All other systems reviewed and are negative.  Physical Exam Updated Vital Signs BP (!) 141/87 (BP Location: Right Arm)   Pulse 97   Temp 99.3 F (37.4 C) (Oral)   Resp 20   SpO2 95%   Physical Exam Vitals and nursing note reviewed.  Constitutional:      Appearance: Normal appearance. He is well-developed.  HENT:     Head: Normocephalic and atraumatic.  Eyes:     Extraocular Movements: Extraocular movements intact.     Conjunctiva/sclera: Conjunctivae normal.     Pupils: Pupils are equal, round, and reactive to light.  Cardiovascular:     Rate and Rhythm: Normal rate and regular rhythm.     Heart sounds: No murmur heard. Pulmonary:     Effort: Pulmonary effort is normal. No respiratory distress.     Breath sounds: Normal breath sounds.  Abdominal:     General: There is no distension.     Palpations: Abdomen is soft.     Tenderness: There is no abdominal tenderness.  Musculoskeletal:        General: No swelling.     Cervical back: Normal range of motion and neck supple.  Skin:    General: Skin is warm and dry.     Capillary Refill: Capillary refill takes less than 2 seconds.  Neurological:      General: No focal deficit present.     Mental Status: He is alert and oriented to person, place, and time.     Cranial Nerves: No cranial nerve deficit.     Sensory: No sensory deficit.     Motor: No weakness.    ED Results / Procedures / Treatments   Labs (all labs ordered are listed, but only abnormal results are displayed) Labs Reviewed  COMPREHENSIVE METABOLIC PANEL - Abnormal; Notable for the following components:      Result Value   Sodium 133 (*)    Glucose, Bld 214 (*)    BUN 36 (*)    Creatinine, Ser 1.62 (*)  Calcium 8.5 (*)    Total Protein 6.1 (*)    Albumin 3.2 (*)    Alkaline Phosphatase 141 (*)    Total Bilirubin 1.3 (*)    GFR, Estimated 47 (*)    All other components within normal limits  CBC WITH DIFFERENTIAL/PLATELET - Abnormal; Notable for the following components:   WBC 13.8 (*)    Hemoglobin 12.4 (*)    HCT 35.7 (*)    MCV 77.1 (*)    Platelets 96 (*)    Neutro Abs 11.9 (*)    Abs Immature Granulocytes 0.10 (*)    All other components within normal limits  URINALYSIS, ROUTINE W REFLEX MICROSCOPIC - Abnormal; Notable for the following components:   Protein, ur TRACE (*)    All other components within normal limits  RESP PANEL BY RT-PCR (FLU A&B, COVID) ARPGX2  LACTIC ACID, PLASMA    EKG None  Radiology CT Abdomen Pelvis W Contrast  Result Date: 05/23/2021 CLINICAL DATA:  Abdominal pain. EXAM: CT ABDOMEN AND PELVIS WITH CONTRAST TECHNIQUE: Multidetector CT imaging of the abdomen and pelvis was performed using the standard protocol following bolus administration of intravenous contrast. CONTRAST:  79m OMNIPAQUE IOHEXOL 350 MG/ML SOLN COMPARISON:  None. FINDINGS: Lower chest: Bibasilar linear atelectasis. The visualized lung bases are otherwise clear. There is calcification of the mitral annulus. No intra-abdominal free air or free fluid. Hepatobiliary: No focal liver abnormality is seen. No gallstones, gallbladder wall thickening, or biliary  dilatation. Pancreas: Unremarkable. No pancreatic ductal dilatation or surrounding inflammatory changes. Spleen: Mild splenomegaly measuring 14 cm in length. Adrenals/Urinary Tract: The adrenal glands are unremarkable. There is no hydronephrosis on either side. There is symmetric enhancement and excretion of contrast by both kidneys. The visualized ureters and urinary bladder appear unremarkable. Stomach/Bowel: There is severe sigmoid diverticulosis and scattered colonic diverticula without active inflammatory changes. There is no bowel obstruction or active inflammation. The appendix is normal. Vascular/Lymphatic: Mild aortoiliac atherosclerotic disease. The IVC is unremarkable. No portal venous gas. There is no adenopathy. Reproductive: Enlarged prostate gland. Prostate brachytherapy seeds noted. The seminal vesicles are symmetric. There is a 3.7 x 1.3 cm high attenuating area posterior to the prostate and anterior to the rectum (81/2) which is of indeterminate etiology but may be related to seed implant. Other: Small fat containing umbilical hernia. Musculoskeletal: Mild degenerative changes of the spine. No acute osseous pathology. IMPRESSION: 1. No acute intra-abdominal or pelvic pathology. 2. Severe sigmoid diverticulosis. No bowel obstruction. Normal appendix. 3. Enlarged prostate gland with brachytherapy seeds. 4. High attenuating area posterior to the prostate and anterior to the rectum of indeterminate etiology, likely related to seed implant. 5. Mild splenomegaly. 6. Aortic Atherosclerosis (ICD10-I70.0). Electronically Signed   By: AAnner CreteM.D.   On: 05/23/2021 22:53   DG Chest Port 1 View  Result Date: 05/23/2021 CLINICAL DATA:  Chills EXAM: PORTABLE CHEST 1 VIEW COMPARISON:  02/11/2021 FINDINGS: Left base linear densities, likely atelectasis. Right lung clear. Heart is normal size. No effusions or acute bony abnormality. IMPRESSION: Left base atelectasis.  No active disease. Electronically  Signed   By: KRolm BaptiseM.D.   On: 05/23/2021 22:07    Procedures Procedures   Medications Ordered in ED Medications  0.9 %  sodium chloride infusion ( Intravenous New Bag/Given 05/23/21 2352)  oxyCODONE-acetaminophen (PERCOCET/ROXICET) 5-325 MG per tablet 1 tablet (1 tablet Oral Given 05/23/21 2341)  ondansetron (ZOFRAN-ODT) disintegrating tablet 4 mg (4 mg Oral Given 05/23/21 1841)  sodium chloride 0.9 %  bolus 1,000 mL (0 mLs Intravenous Stopped 05/23/21 2311)  iohexol (OMNIPAQUE) 350 MG/ML injection 60 mL (60 mLs Intravenous Contrast Given 05/23/21 2205)  sodium chloride 0.9 % bolus 1,000 mL (1,000 mLs Intravenous New Bag/Given 05/23/21 2353)    ED Course  I have reviewed the triage vital signs and the nursing notes.  Pertinent labs & imaging results that were available during my care of the patient were reviewed by me and considered in my medical decision making (see chart for details).    MDM Rules/Calculators/A&P                           Work-up significant for COVID test flu test negative.  Urinalysis normal.  Lactic acid not elevated.  Leukocytosis with a white count of 13.8.  Hemoglobin at 12.4 not significantly anemic.  Platelet count 96,000.  Sodium slightly down at 133.  Glucose 214.  CO2 24.  BUN elevated at 36 creatinine at 1.62 for a GFR of 47.  Also had an alk phos of 141 and a total bili of 1.3.  Based on all this chest x-ray was done no evidence of pneumonia or any acute abnormalities.  Patient's CT scan of the abdomen without any acute findings.  And as mentioned above urinalysis was very normal.  ET scan did show sigmoid diverticulosis but no diverticulitis.  No bowel obstruction.  Did show an enlarged prostate gland with the seeds present.  Gust with on-call urologist Dr. Junious Silk.  We felt that the BUN/creatinine probably mostly elevated due to some dehydration.  Patient received 2 L of normal saline.  He has follow-up with his normal urologist Dr. Diona Fanti tomorrow  afternoon which she will keep that appointment.  CT scan of the abdomen had no real acute findings to explain patient's symptoms.  Patient will be discharged with a prescription for Zofran and hydrocodone. Final Clinical Impression(s) / ED Dia completegnoses Final diagnoses:  None    Rx / DC Orders ED Discharge Orders          Ordered    HYDROcodone-acetaminophen (NORCO/VICODIN) 5-325 MG tablet  Every 6 hours PRN        05/24/21 0045    ondansetron (ZOFRAN ODT) 4 MG disintegrating tablet  Every 8 hours PRN        05/24/21 0045             Fredia Sorrow, MD 05/24/21 [phone]

## 2021-05-24 NOTE — Discharge Instructions (Addendum)
Keep your appointment to follow-up with Dr. Jerelyn Chantrice Hagg status scheduled for tomorrow.  Work-up here tonight to include chest x-ray CT scan without any significant findings urinalysis was normal as well.  Did talk to the on-call urologist Dr. Junious Silk.  And he agreed with you being okay for discharge home.  Labs most likely secondary to some dehydration.  That was the reason for the 2 L of fluid.  Take the Zofran as needed for nausea and vomiting.  Continue to take your Flomax.  And take the hydrocodone as needed for pain.

## 2021-05-25 ENCOUNTER — Emergency Department (HOSPITAL_BASED_OUTPATIENT_CLINIC_OR_DEPARTMENT_OTHER): Payer: Medicare Other

## 2021-05-25 ENCOUNTER — Encounter (HOSPITAL_BASED_OUTPATIENT_CLINIC_OR_DEPARTMENT_OTHER): Payer: Self-pay | Admitting: Emergency Medicine

## 2021-05-25 ENCOUNTER — Other Ambulatory Visit: Payer: Self-pay

## 2021-05-25 ENCOUNTER — Inpatient Hospital Stay (HOSPITAL_BASED_OUTPATIENT_CLINIC_OR_DEPARTMENT_OTHER)
Admission: EM | Admit: 2021-05-25 | Discharge: 2021-05-27 | DRG: 690 | Disposition: A | Discharge status: Home / Self Care | Payer: Medicare Other | Attending: Internal Medicine | Admitting: Internal Medicine

## 2021-05-25 DIAGNOSIS — Z808 Family history of malignant neoplasm of other organs or systems: Secondary | ICD-10-CM | POA: Diagnosis not present

## 2021-05-25 DIAGNOSIS — Z20822 Contact with and (suspected) exposure to covid-19: Secondary | ICD-10-CM | POA: Diagnosis present

## 2021-05-25 DIAGNOSIS — Z800 Family history of malignant neoplasm of digestive organs: Secondary | ICD-10-CM | POA: Diagnosis not present

## 2021-05-25 DIAGNOSIS — D696 Thrombocytopenia, unspecified: Secondary | ICD-10-CM | POA: Diagnosis present

## 2021-05-25 DIAGNOSIS — C61 Malignant neoplasm of prostate: Secondary | ICD-10-CM | POA: Diagnosis present

## 2021-05-25 DIAGNOSIS — Z791 Long term (current) use of non-steroidal anti-inflammatories (NSAID): Secondary | ICD-10-CM

## 2021-05-25 DIAGNOSIS — K5730 Diverticulosis of large intestine without perforation or abscess without bleeding: Secondary | ICD-10-CM | POA: Diagnosis present

## 2021-05-25 DIAGNOSIS — Z7984 Long term (current) use of oral hypoglycemic drugs: Secondary | ICD-10-CM | POA: Diagnosis not present

## 2021-05-25 DIAGNOSIS — N4821 Abscess of corpus cavernosum and penis: Secondary | ICD-10-CM | POA: Diagnosis present

## 2021-05-25 DIAGNOSIS — K5900 Constipation, unspecified: Secondary | ICD-10-CM | POA: Diagnosis present

## 2021-05-25 DIAGNOSIS — Z8042 Family history of malignant neoplasm of prostate: Secondary | ICD-10-CM

## 2021-05-25 DIAGNOSIS — H9192 Unspecified hearing loss, left ear: Secondary | ICD-10-CM | POA: Diagnosis present

## 2021-05-25 DIAGNOSIS — N3000 Acute cystitis without hematuria: Secondary | ICD-10-CM

## 2021-05-25 DIAGNOSIS — R300 Dysuria: Secondary | ICD-10-CM | POA: Diagnosis present

## 2021-05-25 DIAGNOSIS — J9811 Atelectasis: Secondary | ICD-10-CM | POA: Diagnosis present

## 2021-05-25 DIAGNOSIS — E7800 Pure hypercholesterolemia, unspecified: Secondary | ICD-10-CM | POA: Diagnosis present

## 2021-05-25 DIAGNOSIS — R509 Fever, unspecified: Secondary | ICD-10-CM | POA: Diagnosis present

## 2021-05-25 DIAGNOSIS — Z79899 Other long term (current) drug therapy: Secondary | ICD-10-CM | POA: Diagnosis not present

## 2021-05-25 DIAGNOSIS — Z87891 Personal history of nicotine dependence: Secondary | ICD-10-CM | POA: Diagnosis not present

## 2021-05-25 DIAGNOSIS — Z8616 Personal history of COVID-19: Secondary | ICD-10-CM

## 2021-05-25 DIAGNOSIS — E119 Type 2 diabetes mellitus without complications: Secondary | ICD-10-CM | POA: Diagnosis present

## 2021-05-25 DIAGNOSIS — N390 Urinary tract infection, site not specified: Secondary | ICD-10-CM | POA: Diagnosis present

## 2021-05-25 DIAGNOSIS — K6289 Other specified diseases of anus and rectum: Secondary | ICD-10-CM

## 2021-05-25 LAB — COMPREHENSIVE METABOLIC PANEL
ALT: 37 U/L (ref 0–44)
AST: 35 U/L (ref 15–41)
Albumin: 3.1 g/dL — ABNORMAL LOW (ref 3.5–5.0)
Alkaline Phosphatase: 221 U/L — ABNORMAL HIGH (ref 38–126)
Anion gap: 10 (ref 5–15)
BUN: 24 mg/dL — ABNORMAL HIGH (ref 8–23)
CO2: 24 mmol/L (ref 22–32)
Calcium: 8.7 mg/dL — ABNORMAL LOW (ref 8.9–10.3)
Chloride: 102 mmol/L (ref 98–111)
Creatinine, Ser: 1.21 mg/dL (ref 0.61–1.24)
GFR, Estimated: >60 mL/min (ref >60)
Glucose, Bld: 188 mg/dL — ABNORMAL HIGH (ref 70–99)
Potassium: 3.6 mmol/L (ref 3.5–5.1)
Sodium: 136 mmol/L (ref 135–145)
Total Bilirubin: 1.4 mg/dL — ABNORMAL HIGH (ref 0.3–1.2)
Total Protein: 6.4 g/dL — ABNORMAL LOW (ref 6.5–8.1)

## 2021-05-25 LAB — CBC WITH DIFFERENTIAL/PLATELET
Abs Immature Granulocytes: 0.11 10*3/uL — ABNORMAL HIGH (ref 0.00–0.07)
Basophils Absolute: 0 10*3/uL (ref 0.0–0.1)
Basophils Relative: 0 %
Eosinophils Absolute: 0.1 10*3/uL (ref 0.0–0.5)
Eosinophils Relative: 1 %
HCT: 36.1 % — ABNORMAL LOW (ref 39.0–52.0)
Hemoglobin: 12.1 g/dL — ABNORMAL LOW (ref 13.0–17.0)
Immature Granulocytes: 1 %
Lymphocytes Relative: 7 %
Lymphs Abs: 0.9 10*3/uL (ref 0.7–4.0)
MCH: 26.3 pg (ref 26.0–34.0)
MCHC: 33.5 g/dL (ref 30.0–36.0)
MCV: 78.5 fL — ABNORMAL LOW (ref 80.0–100.0)
Monocytes Absolute: 1 10*3/uL (ref 0.1–1.0)
Monocytes Relative: 8 %
Neutro Abs: 10.7 10*3/uL — ABNORMAL HIGH (ref 1.7–7.7)
Neutrophils Relative %: 83 %
Platelets: 78 10*3/uL — ABNORMAL LOW (ref 150–400)
RBC: 4.6 MIL/uL (ref 4.22–5.81)
RDW: 13.5 % (ref 11.5–15.5)
WBC: 12.9 10*3/uL — ABNORMAL HIGH (ref 4.0–10.5)
nRBC: 0 % (ref 0.0–0.2)

## 2021-05-25 LAB — URINALYSIS, ROUTINE W REFLEX MICROSCOPIC
Bilirubin Urine: NEGATIVE
Glucose, UA: NEGATIVE mg/dL
Ketones, ur: NEGATIVE mg/dL
Nitrite: NEGATIVE
Protein, ur: 30 mg/dL — AB
Specific Gravity, Urine: 1.031 — ABNORMAL HIGH (ref 1.005–1.030)
WBC, UA: >50 WBC/hpf — ABNORMAL HIGH (ref 0–5)
pH: 5.5 (ref 5.0–8.0)

## 2021-05-25 LAB — SARS CORONAVIRUS 2 (TAT 6-24 HRS): SARS Coronavirus 2: NEGATIVE

## 2021-05-25 LAB — LACTIC ACID, PLASMA
Lactic Acid, Venous: 1 mmol/L (ref 0.5–1.9)
Lactic Acid, Venous: 0.7 mmol/L (ref 0.5–1.9)

## 2021-05-25 LAB — GLUCOSE, CAPILLARY
Glucose-Capillary: 143 mg/dL — ABNORMAL HIGH (ref 70–99)
Glucose-Capillary: 156 mg/dL — ABNORMAL HIGH (ref 70–99)

## 2021-05-25 LAB — HIV ANTIBODY (ROUTINE TESTING W REFLEX): HIV Screen 4th Generation wRfx: NONREACTIVE

## 2021-05-25 MED ORDER — ONDANSETRON HCL 4 MG PO TABS: 4.0000 mg | ORAL_TABLET | Freq: Four times a day (QID) | ORAL | Status: DC | PRN

## 2021-05-25 MED ORDER — ACETAMINOPHEN 325 MG PO TABS
650.0000 mg | ORAL_TABLET | Freq: Four times a day (QID) | ORAL | Status: DC | PRN
  Administered 2021-05-26: 650 mg via ORAL
  Filled 2021-05-25 (×2): qty 2

## 2021-05-25 MED ORDER — DOCUSATE SODIUM 100 MG PO CAPS
100.0000 mg | ORAL_CAPSULE | Freq: Two times a day (BID) | ORAL | Status: DC
  Administered 2021-05-25 – 2021-05-26 (×2): 100 mg via ORAL
  Filled 2021-05-25 (×4): qty 1

## 2021-05-25 MED ORDER — TAMSULOSIN HCL 0.4 MG PO CAPS: 0.4000 mg | ORAL_CAPSULE | Freq: Every day | ORAL | Status: DC

## 2021-05-25 MED ORDER — INSULIN ASPART 100 UNIT/ML IJ SOLN
0.0000 [IU] | Freq: Three times a day (TID) | INTRAMUSCULAR | Status: DC
  Administered 2021-05-26: 3 [IU] via SUBCUTANEOUS
  Administered 2021-05-26: 2 [IU] via SUBCUTANEOUS
  Administered 2021-05-27: 1 [IU] via SUBCUTANEOUS
  Administered 2021-05-27: 2 [IU] via SUBCUTANEOUS

## 2021-05-25 MED ORDER — HYDROCODONE-ACETAMINOPHEN 5-325 MG PO TABS: 1.0000 | ORAL_TABLET | ORAL | Status: DC | PRN

## 2021-05-25 MED ORDER — ATORVASTATIN CALCIUM 40 MG PO TABS
40.0000 mg | ORAL_TABLET | Freq: Every day | ORAL | Status: DC
  Administered 2021-05-26 – 2021-05-27 (×2): 40 mg via ORAL
  Filled 2021-05-25 (×2): qty 1

## 2021-05-25 MED ORDER — HYDROMORPHONE HCL 1 MG/ML IJ SOLN
1.0000 mg | Freq: Once | INTRAMUSCULAR | Status: AC
  Administered 2021-05-25: 1 mg via INTRAVENOUS
  Filled 2021-05-25: qty 1

## 2021-05-25 MED ORDER — HYDROMORPHONE HCL 1 MG/ML IJ SOLN
0.5000 mg | INTRAMUSCULAR | Status: DC | PRN
  Administered 2021-05-25: 1 mg via INTRAVENOUS
  Administered 2021-05-26: 0.5 mg via INTRAVENOUS
  Filled 2021-05-25 (×2): qty 1

## 2021-05-25 MED ORDER — ACETAMINOPHEN 500 MG PO TABS: 500.0000 mg | ORAL_TABLET | Freq: Four times a day (QID) | ORAL | Status: DC | PRN

## 2021-05-25 MED ORDER — ONDANSETRON 4 MG PO TBDP
4.0000 mg | ORAL_TABLET | Freq: Three times a day (TID) | ORAL | Status: DC | PRN
  Filled 2021-05-25: qty 1

## 2021-05-25 MED ORDER — ONDANSETRON HCL 4 MG/2ML IJ SOLN
4.0000 mg | Freq: Four times a day (QID) | INTRAMUSCULAR | Status: DC | PRN
  Administered 2021-05-26: 4 mg via INTRAVENOUS
  Filled 2021-05-25: qty 2

## 2021-05-25 MED ORDER — SODIUM CHLORIDE 0.9 % IV SOLN: INTRAVENOUS | Status: DC

## 2021-05-25 MED ORDER — SODIUM CHLORIDE 0.9 % IV SOLN
1.0000 g | Freq: Once | INTRAVENOUS | Status: AC
  Administered 2021-05-25: 1 g via INTRAVENOUS
  Filled 2021-05-25: qty 10

## 2021-05-25 MED ORDER — SODIUM CHLORIDE 0.9 % IV SOLN
1.0000 g | INTRAVENOUS | Status: DC
  Administered 2021-05-26 – 2021-05-27 (×2): 1 g via INTRAVENOUS
  Filled 2021-05-25 (×2): qty 10

## 2021-05-25 MED ORDER — BISACODYL 5 MG PO TBEC: 5.0000 mg | DELAYED_RELEASE_TABLET | Freq: Every day | ORAL | Status: DC | PRN

## 2021-05-25 MED ORDER — SENNA 8.6 MG PO TABS
1.0000 | ORAL_TABLET | Freq: Two times a day (BID) | ORAL | Status: DC
  Administered 2021-05-25 – 2021-05-26 (×2): 8.6 mg via ORAL
  Filled 2021-05-25 (×3): qty 1

## 2021-05-25 MED ORDER — SODIUM CHLORIDE 0.9 % IV BOLUS: 1000.0000 mL | Freq: Once | INTRAVENOUS | Status: DC

## 2021-05-25 MED ORDER — HYDROCODONE-ACETAMINOPHEN 5-325 MG PO TABS: 1.0000 | ORAL_TABLET | Freq: Four times a day (QID) | ORAL | Status: DC | PRN

## 2021-05-25 MED ORDER — ACETAMINOPHEN 650 MG RE SUPP: 650.0000 mg | Freq: Four times a day (QID) | RECTAL | Status: DC | PRN

## 2021-05-25 NOTE — ED Triage Notes (Signed)
Reports seed implanted a month ago for prostate cancer.  Having rectal pain and issues with constipation.  Also reports having chills at time as lack of appetite and nausea.  Used zofran pta.

## 2021-05-25 NOTE — Care Plan (Signed)
Called for direct admission from [facility] for 65 year old male history of prostate cancer recently had seeds placed on 21 July presents with abdominal pain diarrhea pain at prostate.  Also having some decreased p.o. intake and chills.  White count of 12.  UA pending.  CT from 2 days ago showing severe sigmoid diverticulosis no obstruction but enlarged prostate gland with brachytherapy seeds high attenuating area posterior to the prostate and anterior to the rectum of indeterminate etiology.  Spoke w/ Dr. Huel Cote having ongoing rectal pain, worse pain w/ fever 101 last night.  Pt seen urology-Dr. Diona Fanti, placed on augmentin and due to worsening pain, pt encouraged to come to ED to be evaluated.  Pt given rocephin and dilaudid at OSH. Dr. Claud[facility].  Consult urologist on arrival.  Accept to med telemetry.

## 2021-05-25 NOTE — ED Provider Notes (Signed)
[facility] EMERGENCY DEPT Provider Note   CSN: [id number] Arrival date & time: 05/25/21  [id number]     History Chief Complaint  Patient presents with   Rectal Pain   Constipation    Casey Wilcox is a 65 y.o. male.  65 year old male with a history of prostate cancer s/p radioactive seed placement 1 month ago on 7/21 presenting with ongoing rectal pain which has worsened in the past 3 days with associated fever and chills.  Temperature measured at 101 F last night.  He also has not had a BM in 3 days.  He also reports associated dysuria and lower abdominal pain. Denies rectal bleeding, hematuria.  Patient was seen in the ED on 8/21 for similar symptoms.  He had labs, CXR, and CT A/P which were largely unremarkable.  He was given IV fluids, and after consulting with on-call urologist, he was discharged with hydrocodone and Zofran.  Patient states he saw his primary urologist (Dr. Diona Fanti) yesterday who started him on Augmentin due to concern for infection.  Patient states his pain has been worsening and was told to come to the ED after speaking with his urologist this morning.  Wife states that he has overall not been feeling well since radioactive seeds were placed, but things have progressed significantly in the past 3 days.  On chart review, he had a colonoscopy in 2018 in which multiple polyps were removed, none with malignant features.  The history is provided by the patient and the spouse. No language interpreter was used.  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 everywhere   Prostate cancer ([place]) [id number]   Umbilical hernia [id number]   asymptomatic   Wears glasses 04/16/2021    Patient Active Problem List   Diagnosis Date Noted   Fever 05/25/2021   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   History of adenomatous polyp of colon 12/29/2020   Hypercholesterolemia 12/29/2020   Localized, 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/2020   Malignant neoplasm of prostate ([place]) 12/29/2020   Sleep disturbance 12/29/2020   Squamous cell carcinoma of skin of face 12/29/2020   Type 2 diabetes mellitus without complications ([place]) [id number]   Asymmetrical left sensorineural hearing loss 11/19/2019   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[place]) 11/19/2019    Past Surgical History:  Procedure Laterality Date   COLONOSCOPY WITH PROPOFOL N/A 12/20/2016   Procedure: COLONOSCOPY WITH PROPOFOL;  Surgeon: Garlan Fair, MD;  Location: [facility];  Service: Endoscopy;  Laterality: N/A;   CYSTOSCOPY  04/22/2021   Procedure: CYSTOSCOPY FLEXIBLE;  Surgeon: Franchot Gallo, MD;  Location: [facility];  Service: Urology;;   FOOT SURGERY Bilateral    HERNIA REPAIR     inguinal   RADIOACTIVE SEED IMPLANT N/A 04/22/2021   Procedure: RADIOACTIVE SEED IMPLANT/BRACHYTHERAPY IMPLANT;  Surgeon: Franchot Gallo, MD;  Location: [facility];  Service: Urology;  Laterality: N/A;  69 seeds implanted  SPACE OAR INSTILLATION N/A 04/22/2021   Procedure: SPACE OAR INSTILLATION;  Surgeon: Franchot Gallo, MD;  Location: [facility];  Service: Urology;  Laterality: N/A;       Family History  Problem Relation Age of Onset   Colon cancer Mother    Colon cancer Father    Prostate cancer Paternal Uncle    Head & neck cancer Paternal Uncle    Breast cancer Neg Hx    Pancreatic cancer Neg Hx     Social History   Tobacco  Use   Smoking status: Former    Packs/day: 1.00    Years: 6.00    Pack years: 6.00    Types: Cigarettes    Quit date: 10/04/1975    Years since quitting: 45.6   Smokeless tobacco: Never  Vaping Use   Vaping Use: Never used  Substance Use Topics   Alcohol use: Yes    Comment: daily 1-2   Drug use: No    Home Medications Prior to Admission medications   Medication Sig Start Date End Date Taking? Authorizing Provider  acetaminophen (TYLENOL) 500 MG tablet Take 500 mg by mouth every 6 (six) hours as needed.   Yes [provider]  amoxicillin-clavulanate (AUGMENTIN) 875-125 MG tablet Take 1 tablet by mouth 2 (two) times daily.   Yes [provider]  atorvastatin (LIPITOR) 40 MG tablet Take 40 mg by mouth daily. 12/09/20  Yes [provider]  bisacodyl (DULCOLAX) 5 MG EC tablet Take 5 mg by mouth daily as needed for moderate constipation.   Yes [provider]  HYDROcodone-acetaminophen (NORCO/VICODIN) 5-325 MG tablet Take 1 tablet by mouth every 6 (six) hours as needed for moderate pain. 05/24/21  Yes Fredia Sorrow, MD  meloxicam (MOBIC) 15 MG tablet Take 15 mg by mouth daily.   Yes [provider]  metFORMIN (GLUCOPHAGE-XR) 500 MG 24 hr tablet Take 500 mg by mouth daily. With evening meal   Yes [provider]  ondansetron (ZOFRAN ODT) 4 MG disintegrating tablet Take 1 tablet (4 mg total) by mouth every 8 (eight) hours as needed for nausea or vomiting. 05/24/21  Yes Fredia Sorrow, MD  tamsulosin (FLOMAX) 0.4 MG CAPS capsule Take 0.4 mg by mouth daily.   Yes [provider]    Allergies    Patient has no known allergies.  Review of Systems   Review of Systems  Constitutional:  Positive for chills and fever.  Respiratory:  Negative for shortness of breath.   Cardiovascular:  Negative for chest pain.  Gastrointestinal:  Positive for constipation.  Genitourinary:  Positive for dysuria. Negative for hematuria.   Musculoskeletal:  Negative for back pain.   Physical Exam Updated Vital Signs BP 113/74   Pulse 77   Temp 98.2 F (36.8 C) (Oral)   Resp 15   Ht '5\' 7"'$  (1.702 m)   Wt 68 kg   SpO2 97%   BMI 23.49 kg/m   Physical Exam Vitals and nursing note reviewed.  Constitutional:      Appearance: He is well-developed.     Comments: Laying in bed wrapped with multiple blankets, shivers when blankets removed  HENT:     Head: Normocephalic and atraumatic.  Eyes:     Conjunctiva/sclera: Conjunctivae normal.  Cardiovascular:     Rate and Rhythm: Normal rate and regular rhythm.     Heart sounds: No murmur heard. Pulmonary:     Effort: Pulmonary effort is normal. No respiratory distress.  Breath sounds: Normal breath sounds.  Abdominal:     General: Bowel sounds are normal.     Palpations: Abdomen is soft.     Tenderness: There is abdominal tenderness. There is no right CVA tenderness, left CVA tenderness, guarding or rebound.     Comments: Tenderness to left lower quadrant  Musculoskeletal:     Cervical back: Neck supple.  Skin:    General: Skin is warm and dry.  Neurological:     Mental Status: He is alert.    ED Results / Procedures / Treatments   Labs (all labs ordered are listed, but only abnormal results are displayed) Labs Reviewed  COMPREHENSIVE METABOLIC PANEL - Abnormal; Notable for the following components:      Result Value   Glucose, Bld 188 (*)    BUN 24 (*)    Calcium 8.7 (*)    Total Protein 6.4 (*)    Albumin 3.1 (*)    Alkaline Phosphatase 221 (*)    Total Bilirubin 1.4 (*)    All other components within normal limits  CBC WITH DIFFERENTIAL/PLATELET - Abnormal; Notable for the following components:   WBC 12.9 (*)    Hemoglobin 12.1 (*)    HCT 36.1 (*)    MCV 78.5 (*)    Platelets 78 (*)    Neutro Abs 10.7 (*)    Abs Immature Granulocytes 0.11 (*)    All other components within normal limits  URINALYSIS, ROUTINE W REFLEX MICROSCOPIC - Abnormal;  Notable for the following components:   APPearance HAZY (*)    Specific Gravity, Urine 1.031 (*)    Hgb urine dipstick TRACE (*)    Protein, ur 30 (*)    Leukocytes,Ua LARGE (*)    WBC, UA >50 (*)    Bacteria, UA RARE (*)    All other components within normal limits  CULTURE, BLOOD (ROUTINE X 2)  CULTURE, BLOOD (ROUTINE X 2)  URINE CULTURE  SARS CORONAVIRUS 2 (TAT 6-24 HRS)  LACTIC ACID, PLASMA  LACTIC ACID, PLASMA    EKG None  Radiology CT Abdomen Pelvis W Contrast  Result Date: 05/23/2021 CLINICAL DATA:  Abdominal pain. EXAM: CT ABDOMEN AND PELVIS WITH CONTRAST TECHNIQUE: Multidetector CT imaging of the abdomen and pelvis was performed using the standard protocol following bolus administration of intravenous contrast. CONTRAST:  17m OMNIPAQUE IOHEXOL 350 MG/ML SOLN COMPARISON:  None. FINDINGS: Lower chest: Bibasilar linear atelectasis. The visualized lung bases are otherwise clear. There is calcification of the mitral annulus. No intra-abdominal free air or free fluid. Hepatobiliary: No focal liver abnormality is seen. No gallstones, gallbladder wall thickening, or biliary dilatation. Pancreas: Unremarkable. No pancreatic ductal dilatation or surrounding inflammatory changes. Spleen: Mild splenomegaly measuring 14 cm in length. Adrenals/Urinary Tract: The adrenal glands are unremarkable. There is no hydronephrosis on either side. There is symmetric enhancement and excretion of contrast by both kidneys. The visualized ureters and urinary bladder appear unremarkable. Stomach/Bowel: There is severe sigmoid diverticulosis and scattered colonic diverticula without active inflammatory changes. There is no bowel obstruction or active inflammation. The appendix is normal. Vascular/Lymphatic: Mild aortoiliac atherosclerotic disease. The IVC is unremarkable. No portal venous gas. There is no adenopathy. Reproductive: Enlarged prostate gland. Prostate brachytherapy seeds noted. The seminal vesicles  are symmetric. There is a 3.7 x 1.3 cm high attenuating area posterior to the prostate and anterior to the rectum (81/2) which is of indeterminate etiology but may be related to seed implant. Other: Small fat containing umbilical hernia. Musculoskeletal: Mild degenerative  changes of the spine. No acute osseous pathology. IMPRESSION: 1. No acute intra-abdominal or pelvic pathology. 2. Severe sigmoid diverticulosis. No bowel obstruction. Normal appendix. 3. Enlarged prostate gland with brachytherapy seeds. 4. High attenuating area posterior to the prostate and anterior to the rectum of indeterminate etiology, likely related to seed implant. 5. Mild splenomegaly. 6. Aortic Atherosclerosis (ICD10-I70.0). Electronically Signed   By: Anner Crete M.D.   On: 05/23/2021 22:53   DG Chest Port 1 View  Result Date: 05/25/2021 CLINICAL DATA:  Ankle pain.  Brachytherapy seed implants EXAM: PORTABLE CHEST 1 VIEW COMPARISON:  None. FINDINGS: Normal mediastinum and cardiac silhouette. Normal pulmonary vasculature. Mild LEFT basilar atelectasis. No evidence of effusion, infiltrate, or pneumothorax. No acute bony abnormality. IMPRESSION: Mild LEFT basilar atelectasis. Electronically Signed   By: Suzy Bouchard M.D.   On: 05/25/2021 10:19   DG Chest Port 1 View  Result Date: 05/23/2021 CLINICAL DATA:  [place] EXAM: PORTABLE CHEST 1 VIEW COMPARISON:  02/11/2021 FINDINGS: Left base linear densities, likely atelectasis. Right lung clear. Heart is normal size. No effusions or acute bony abnormality. IMPRESSION: Left base atelectasis.  No active disease. Electronically Signed   By: Rolm Baptise M.D.   On: 05/23/2021 22:07    Procedures Procedures   Medications Ordered in ED Medications  HYDROmorphone (DILAUDID) injection 1 mg (1 mg Intravenous Given 05/25/21 1025)  cefTRIAXone (ROCEPHIN) 1 g in sodium chloride 0.9 % 100 mL IVPB (1 g Intravenous New Bag/Given 05/25/21 1307)  HYDROmorphone (DILAUDID) injection 1 mg (1 mg  Intravenous Given 05/25/21 1330)    ED Course  I have reviewed the triage vital signs and the nursing notes.  Pertinent labs & imaging results that were available during my care of the patient were reviewed by me and considered in my medical decision making (see chart for details).    MDM Rules/Calculators/A&P                         65 year old male with a history of prostate cancer s/p radioactive seed placement presenting with 3 days of worsening rectal pain and fever.  In the ED, he is afebrile, VSS.  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  We will initiate sepsis work-up with labs, UA, urine culture, and CXR.  Will give Dilaudid for pain.  Pending results, will discuss with urology regarding possible admission.  Labs overall unremarkable other than mild leukocytosis with WBC 12.9 and thrombocytopenia with platelets 78, though no significant change from labs obtained 2 days prior.  She is he also has elevated ALP and mildly elevated total bilirubin.  UA obtained with large leukocytes and rare bacteria.  On reassessment, pain is significantly improved with Dilaudid.  Spoke with Dr. Gloriann Loan, urologist on-call, who recommends medicine admission and urology will follow along as consult.  Also recommended starting ceftriaxone for treatment of UTI.  Spoke with Dr. Dwyane Dee, with hospitalist service at [facility] who agrees for admission.  Final Clinical Impression(s) / ED Diagnoses Final diagnoses:  Acute cystitis without hematuria  Rectal pain    Rx / DC Orders ED Discharge Orders     None        Zola Button, MD 05/25/21 1427    Lacretia Leigh, MD 05/30/21 [phone]

## 2021-05-25 NOTE — H&P (Signed)
History and Physical    Casey Wilcox [id number] DOB: 16-May-1956 DOA: 05/25/2021  PCP: Casey Orn, MD   Patient coming from: [facility] from [place]  I have personally briefly reviewed patient's old medical records in [place]  Chief Complaint: prostate pain  HPI: Casey Wilcox is a 65 y.o. male with medical history significant of prostate cancer and prostate pain from seeds planted on April 22, 2021.  Patient also had some decreased p.o. intake, subjective fevers and chills.  Patient was transferred from [facility].  CT from 2 days ago showing severe sigmoid diverticulosis no obstruction but enlarged prostate gland with brachytherapy seeds high attenuating area posterior to the prostate and anterior to the rectum of indeterminate etiology.  I spoke with ED provider Dr. Landis Gandy who discussed the patient with Dr. Gloriann Loan with [facility] who recommend transfer and they will evaluate on arrival.  Patient was given Rocephin and Dilaudid outside hospital.  Temperature 100 here.  White count 12.9.  UA showing large leuks but no nitrites rare bacteria.  Chest x-ray showed mild left basilar atelectasis.  Patient was seen by his urologist Dr.Dahlstedt yesterday and placed on Augmentin due to worsening pain.  No bowel movement 3 days.  Patient endorsing dysuria and fever as well.  Pt endorses dysuria  ED Course: as noted above  Review of Systems: As per HPI otherwise all other systems reviewed and are negative. 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/auditory canals mri care everywhere   Prostate cancer ([place]) [id number]   Umbilical hernia [id number]    asymptomatic   Wears glasses 04/16/2021    Past Surgical History:  Procedure Laterality Date   COLONOSCOPY WITH PROPOFOL N/A 12/20/2016   Procedure: COLONOSCOPY WITH PROPOFOL;  Surgeon: Casey Fair, MD;  Location: [facility];  Service: Endoscopy;  Laterality: N/A;   CYSTOSCOPY  04/22/2021   Procedure: CYSTOSCOPY FLEXIBLE;  Surgeon: Casey Gallo, MD;  Location: [facility];  Service: Urology;;   FOOT SURGERY Bilateral    HERNIA REPAIR     inguinal   RADIOACTIVE SEED IMPLANT N/A 04/22/2021   Procedure: RADIOACTIVE SEED IMPLANT/BRACHYTHERAPY IMPLANT;  Surgeon: Casey Gallo, MD;  [facility];  Service: Urology;  Laterality: N/A;  69 seeds implanted   SPACE OAR INSTILLATION N/A 04/22/2021   Procedure: SPACE OAR INSTILLATION;  Surgeon: Casey Gallo, MD;  Location: [facility];  Service: Urology;  Laterality: N/A;    Social History  reports that he quit smoking about 45 years ago. His smoking use included cigarettes. He has a 6.00 pack-year smoking history. He has never used smokeless tobacco. He reports current alcohol use. He reports that he does not use drugs.  No Known Allergies  Family History  Problem Relation Age of Onset   Colon cancer Mother    Colon cancer Father    Prostate cancer Paternal Uncle    Head & neck cancer Paternal Uncle    Breast cancer Neg Hx    Pancreatic cancer Neg Hx      Prior to Admission medications   Medication Sig Start  Date End Date Taking? Authorizing Provider  acetaminophen (TYLENOL) 500 MG tablet Take 500 mg by mouth every 6 (six) hours as needed.   Yes [provider]  amoxicillin-clavulanate (AUGMENTIN) 875-125 MG tablet Take 1 tablet by mouth 2 (two) times daily.   Yes [provider]  atorvastatin (LIPITOR) 40 MG tablet Take 40 mg by mouth daily. 12/09/20  Yes [provider]  bisacodyl (DULCOLAX) 5 MG EC tablet Take 5 mg by mouth daily as  needed for moderate constipation.   Yes [provider]  HYDROcodone-acetaminophen (NORCO/VICODIN) 5-325 MG tablet Take 1 tablet by mouth every 6 (six) hours as needed for moderate pain. 05/24/21  Yes Fredia Sorrow, MD  meloxicam (MOBIC) 15 MG tablet Take 15 mg by mouth daily.   Yes [provider]  metFORMIN (GLUCOPHAGE-XR) 500 MG 24 hr tablet Take 500 mg by mouth daily. With evening meal   Yes [provider]  ondansetron (ZOFRAN ODT) 4 MG disintegrating tablet Take 1 tablet (4 mg total) by mouth every 8 (eight) hours as needed for nausea or vomiting. 05/24/21  Yes Fredia Sorrow, MD  tamsulosin (FLOMAX) 0.4 MG CAPS capsule Take 0.4 mg by mouth daily.   Yes [provider]    Physical Exam: Vitals:   05/25/21 1230 05/25/21 1245 05/25/21 1502 05/25/21 1714  BP: 115/67 113/74 101/65 93/80  Pulse: 75 77 78 100  Resp: 16 15 (!) 22 18  Temp:   98.4 F (36.9 C) 100 F (37.8 C)  TempSrc:   Oral Oral  SpO2: 95% 97% 96% 94%  Weight:      Height:        Constitutional: NAD, calm, comfortable Vitals:   05/25/21 1230 05/25/21 1245 05/25/21 1502 05/25/21 1714  BP: 115/67 113/74 101/65 93/80  Pulse: 75 77 78 100  Resp: 16 15 (!) 22 18  Temp:   98.4 F (36.9 C) 100 F (37.8 C)  TempSrc:   Oral Oral  SpO2: 95% 97% 96% 94%  Weight:      Height:       Eyes: PERRL, lids and conjunctivae normal ENMT: Mucous membranes are moist. Posterior pharynx clear of any exudate or lesions.Normal dentition.  Neck: normal, supple, no masses, no thyromegaly Respiratory: clear to auscultation bilaterally, no wheezing, no crackles. Normal respiratory effort. No accessory muscle use.  Cardiovascular: Regular rate and rhythm, no murmurs / rubs / gallops. No extremity edema. 2+ pedal pulses. No carotid bruits.  Abdomen: +BS, soft, tender to palpation, llq Musculoskeletal: no clubbing / cyanosis. No joint deformity upper and lower extremities. Good ROM, no contractures.  Normal muscle tone.  Skin: no rashes, lesions, ulcers. No induration Neurologic: CN 2-12 grossly intact. Sensation intact, DTR normal. Strength 5/5 in all 4.  Psychiatric: Normal judgment and insight. Alert and oriented x 3. Normal mood.    Labs on Admission: I have personally reviewed following labs and imaging studies  CBC: Recent Labs  Lab 05/23/21 1610 05/25/21 1035  WBC 13.8* 12.9*  NEUTROABS 11.9* 10.7*  HGB 12.4* 12.1*  HCT 35.7* 36.1*  MCV 77.1* 78.5*  PLT 96* 78*    Basic Metabolic Panel: Recent Labs  Lab 05/23/21 1610 05/25/21 1035  NA 133* 136  K 3.6 3.6  CL 99 102  CO2 24 24  GLUCOSE 214* 188*  BUN 36* 24*  CREATININE 1.62* 1.21  CALCIUM 8.5* 8.7*    GFR: Estimated Creatinine Clearance: 56.9 mL/min (by C-G formula based on SCr of 1.21 mg/dL).  Liver  Function Tests: Recent Labs  Lab 05/23/21 1610 05/25/21 1035  AST 24 35  ALT 22 37  ALKPHOS 141* 221*  BILITOT 1.3* 1.4*  PROT 6.1* 6.4*  ALBUMIN 3.2* 3.1*    Urine analysis:    Component Value Date/Time   COLORURINE YELLOW 05/25/2021 1229   APPEARANCEUR HAZY (A) 05/25/2021 1229   LABSPEC 1.031 (H) 05/25/2021 1229   PHURINE 5.5 05/25/2021 1229   GLUCOSEU NEGATIVE 05/25/2021 1229   HGBUR TRACE (A) 05/25/2021 1229   BILIRUBINUR NEGATIVE 05/25/2021 [place] 05/25/2021 1229   PROTEINUR 30 (A) 05/25/2021 1229   NITRITE NEGATIVE 05/25/2021 1229   LEUKOCYTESUR LARGE (A) 05/25/2021 1229    Radiological Exams on Admission: CT Abdomen Pelvis W Contrast  Result Date: 05/23/2021 CLINICAL DATA:  Abdominal pain. EXAM: CT ABDOMEN AND PELVIS WITH CONTRAST TECHNIQUE: Multidetector CT imaging of the abdomen and pelvis was performed using the standard protocol following bolus administration of intravenous contrast. CONTRAST:  68m OMNIPAQUE IOHEXOL 350 MG/ML SOLN COMPARISON:  None. FINDINGS: Lower chest: Bibasilar linear atelectasis. The visualized lung bases are otherwise clear. There is  calcification of the mitral annulus. No intra-abdominal free air or free fluid. Hepatobiliary: No focal liver abnormality is seen. No gallstones, gallbladder wall thickening, or biliary dilatation. Pancreas: Unremarkable. No pancreatic ductal dilatation or surrounding inflammatory changes. Spleen: Mild splenomegaly measuring 14 cm in length. Adrenals/Urinary Tract: The adrenal glands are unremarkable. There is no hydronephrosis on either side. There is symmetric enhancement and excretion of contrast by both kidneys. The visualized ureters and urinary bladder appear unremarkable. Stomach/Bowel: There is severe sigmoid diverticulosis and scattered colonic diverticula without active inflammatory changes. There is no bowel obstruction or active inflammation. The appendix is normal. Vascular/Lymphatic: Mild aortoiliac atherosclerotic disease. The IVC is unremarkable. No portal venous gas. There is no adenopathy. Reproductive: Enlarged prostate gland. Prostate brachytherapy seeds noted. The seminal vesicles are symmetric. There is a 3.7 x 1.3 cm high attenuating area posterior to the prostate and anterior to the rectum (81/2) which is of indeterminate etiology but may be related to seed implant. Other: Small fat containing umbilical hernia. Musculoskeletal: Mild degenerative changes of the spine. No acute osseous pathology. IMPRESSION: 1. No acute intra-abdominal or pelvic pathology. 2. Severe sigmoid diverticulosis. No bowel obstruction. Normal appendix. 3. Enlarged prostate gland with brachytherapy seeds. 4. High attenuating area posterior to the prostate and anterior to the rectum of indeterminate etiology, likely related to seed implant. 5. Mild splenomegaly. 6. Aortic Atherosclerosis (ICD10-I70.0). Electronically Signed   By: AAnner CreteM.D.   On: 05/23/2021 22:53   DG Chest Port 1 View  Result Date: 05/25/2021 CLINICAL DATA:  Ankle pain.  Brachytherapy seed implants EXAM: PORTABLE CHEST 1 VIEW COMPARISON:   None. FINDINGS: Normal mediastinum and cardiac silhouette. Normal pulmonary vasculature. Mild LEFT basilar atelectasis. No evidence of effusion, infiltrate, or pneumothorax. No acute bony abnormality. IMPRESSION: Mild LEFT basilar atelectasis. Electronically Signed   By: SSuzy BouchardM.D.   On: 05/25/2021 10:19   DG Chest Port 1 View  Result Date: 05/23/2021 CLINICAL DATA:  Chills EXAM: PORTABLE CHEST 1 VIEW COMPARISON:  02/11/2021 FINDINGS: Left base linear densities, likely atelectasis. Right lung clear. Heart is normal size. No effusions or acute bony abnormality. IMPRESSION: Left base atelectasis.  No active disease. Electronically Signed   By: KRolm BaptiseM.D.   On: 05/23/2021 22:07     Assessment/Plan Principal Problem:   Dysuria Active Problems:   Hypercholesterolemia   Type 2 diabetes mellitus without  complications ([place])   Fever   Constipation  65 year old male with a history of prostate cancer underwent recent radiation seeding last month with dysuria, constipation and pain in prostate area.  Hospitalist service called for admission and urology will consult. Prostate cancer/dysuria-leukocytes in urine bacteria on UA, continue Rocephin Follow-up urine culture Follow-up urology consult Dr. Marlan [facility], resident Dr. Jimmy Footman. Continue home tamsulosin As needed Tylenol, hydrocodone and Dilaudid for pain  Fever-due to above we will give as needed Tylenol  Constipation-given bowel regimen senna and Colace  Hyperlipidemia-continue statin  Nausea-as needed Zofran  Diabetes type 2-follow-up blood sugar checks and give sliding scale insulin  Disposition-admit patient, telemetry, continue to monitor  DVT prophylaxis: SCDs due to thrombocytopenia code Status:   full Family Communication:  na Disposition Plan:   Patient is from:  home  Anticipated DC to:  home  Anticipated DC date:  tbd  Anticipated DC barriers: tbd  Consults called:  Urology-Dr. Jimmy Footman Admission  status:  Inpt, med/tele  Severity of Illness: The appropriate patient status for this patient is INPATIENT. Inpatient status is judged to be reasonable and necessary in order to provide the required intensity of service to ensure the patient's safety. The patient's presenting symptoms, physical exam findings, and initial radiographic and laboratory data in the context of their chronic comorbidities is felt to place them at high risk for further clinical deterioration. Furthermore, it is not anticipated that the patient will be medically stable for discharge from the hospital within 2 midnights of admission. The following factors support the patient status of inpatient.   " The patient's presenting symptoms include as above. " The worrisome physical exam findings include as above. " The initial radiographic and laboratory data are worrisome because of as above. " The chronic co-morbidities include as above.   * I certify that at the point of admission it is my clinical judgment that the patient will require inpatient hospital care spanning beyond 2 midnights from the point of admission due to high intensity of service, high risk for further deterioration and high frequency of surveillance required.Jacqlyn Krauss MD [facility] [id number] How to contact the [facility] Attending or Consulting provider [place] or covering provider during after hours [place], for this patient?   Check the care team in [facility] [place] and look for a) attending/consulting [facility] provider listed and b) the [facility] team listed Log into [email or web address] and use [place]'s universal password to access. If you do not have the password, please contact the hospital operator. Locate the [facility] provider you are looking for under [facility] and page to a number that you can be directly reached. If you still have difficulty reaching the provider, please page the [facility] (Director on Call) for the Hospitalists listed on amion for  assistance.  05/25/2021, 5:45 PM

## 2021-05-25 NOTE — ED Provider Notes (Signed)
I saw and evaluated the patient, reviewed the resident's note and I agree with the findings and plan.  65 year old male who presents with persistent pain around his prostate.  Has history of prostate cancer and has seeds around that area.  Had fever at home of 101.  Patient to have labs done here along with give analgesics.  Plan will be for inpatient admission pending laboratory results and urology consultation   Lacretia Leigh, MD 05/25/21 [phone]

## 2021-05-25 NOTE — ED Notes (Signed)
Care handoff report given to care link and to Jerrel Ivory on [place] at [facility].

## 2021-05-26 ENCOUNTER — Inpatient Hospital Stay (HOSPITAL_COMMUNITY): Payer: Medicare Other

## 2021-05-26 DIAGNOSIS — R300 Dysuria: Secondary | ICD-10-CM | POA: Diagnosis not present

## 2021-05-26 LAB — CBC
HCT: 35.9 % — ABNORMAL LOW (ref 39.0–52.0)
Hemoglobin: 11.8 g/dL — ABNORMAL LOW (ref 13.0–17.0)
MCH: 26.7 pg (ref 26.0–34.0)
MCHC: 32.9 g/dL (ref 30.0–36.0)
MCV: 81.2 fL (ref 80.0–100.0)
Platelets: 75 10*3/uL — ABNORMAL LOW (ref 150–400)
RBC: 4.42 MIL/uL (ref 4.22–5.81)
RDW: 13.9 % (ref 11.5–15.5)
WBC: 13.7 10*3/uL — ABNORMAL HIGH (ref 4.0–10.5)
nRBC: 0 % (ref 0.0–0.2)

## 2021-05-26 LAB — COMPREHENSIVE METABOLIC PANEL
ALT: 32 U/L (ref 0–44)
AST: 25 U/L (ref 15–41)
Albumin: 2.6 g/dL — ABNORMAL LOW (ref 3.5–5.0)
Alkaline Phosphatase: 207 U/L — ABNORMAL HIGH (ref 38–126)
Anion gap: 8 (ref 5–15)
BUN: 24 mg/dL — ABNORMAL HIGH (ref 8–23)
CO2: 25 mmol/L (ref 22–32)
Calcium: 8.5 mg/dL — ABNORMAL LOW (ref 8.9–10.3)
Chloride: 104 mmol/L (ref 98–111)
Creatinine, Ser: 1.13 mg/dL (ref 0.61–1.24)
GFR, Estimated: >60 mL/min (ref >60)
Glucose, Bld: 115 mg/dL — ABNORMAL HIGH (ref 70–99)
Potassium: 4.2 mmol/L (ref 3.5–5.1)
Sodium: 137 mmol/L (ref 135–145)
Total Bilirubin: 1.3 mg/dL — ABNORMAL HIGH (ref 0.3–1.2)
Total Protein: 5.9 g/dL — ABNORMAL LOW (ref 6.5–8.1)

## 2021-05-26 LAB — HEMOGLOBIN A1C
Hgb A1c MFr Bld: 8 % — ABNORMAL HIGH (ref 4.8–5.6)
Mean Plasma Glucose: 182.9 mg/dL

## 2021-05-26 LAB — GLUCOSE, CAPILLARY
Glucose-Capillary: 128 mg/dL — ABNORMAL HIGH (ref 70–99)
Glucose-Capillary: 191 mg/dL — ABNORMAL HIGH (ref 70–99)
Glucose-Capillary: 209 mg/dL — ABNORMAL HIGH (ref 70–99)
Glucose-Capillary: 170 mg/dL — ABNORMAL HIGH (ref 70–99)

## 2021-05-26 MED ORDER — KETOROLAC TROMETHAMINE 15 MG/ML IJ SOLN: 15.0000 mg | Freq: Three times a day (TID) | INTRAMUSCULAR | Status: DC | PRN

## 2021-05-26 MED ORDER — IOHEXOL 9 MG/ML PO SOLN
500.0000 mL | ORAL | Status: AC
  Administered 2021-05-26 (×2): 500 mL via ORAL

## 2021-05-26 MED ORDER — TAMSULOSIN HCL 0.4 MG PO CAPS
0.4000 mg | ORAL_CAPSULE | Freq: Two times a day (BID) | ORAL | Status: DC
  Administered 2021-05-26 – 2021-05-27 (×3): 0.4 mg via ORAL
  Filled 2021-05-26 (×3): qty 1

## 2021-05-26 MED ORDER — BELLADONNA ALKALOIDS-OPIUM 16.2-60 MG RE SUPP: 1.0000 | Freq: Three times a day (TID) | RECTAL | Status: DC | PRN

## 2021-05-26 MED ORDER — IOHEXOL 350 MG/ML SOLN
100.0000 mL | Freq: Once | INTRAVENOUS | Status: AC | PRN
  Administered 2021-05-26: 80 mL via INTRAVENOUS

## 2021-05-26 MED ORDER — IOHEXOL 9 MG/ML PO SOLN
ORAL | Status: AC
  Filled 2021-05-26: qty 1000

## 2021-05-26 MED ORDER — OXYCODONE-ACETAMINOPHEN 5-325 MG PO TABS
1.0000 | ORAL_TABLET | Freq: Four times a day (QID) | ORAL | Status: DC | PRN
  Administered 2021-05-26 (×2): 2 via ORAL
  Filled 2021-05-26 (×2): qty 2

## 2021-05-26 NOTE — Consult Note (Addendum)
a  Urology Consult Note   Requesting Attending Physician:  Dessa Phi, DO Service Providing Consult: Urology  Consulting Attending: Dr. Link Snuffer   Reason for Consult:  Fevers, chills, dysuria  HPI: Casey Wilcox is seen in consultation for reasons noted above at the request of Dessa Phi, DO for evaluation of fevers/chills/dysuria, concern for UTI.  This is a 65 y.o. male with Hx of PCa s/p bracytherapy w/ SpaceOAR on 04/22/2021. Had some increased difficulty w/ urination at home post-procedure w/ intermittent dysuria; however, otherwise did well. Over the past week had issues with subjective fevers, chills, and initially diarrhea, which has turned into constipation after taking over the counter imodium.   He does endorse rectal pain over the past week as well.  At OSH WBC of 13.8 UA w/ large leuks, no nitrites, rare bacteria - however had started PO Augmentin x 24 hours  CT scan without evidence of intra-abdominal/pelvic pathology. Demonstrated presence of brachytherapy seeds and SpaceOAR in the prostate and perirectal space respectively.   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/auditory canals mri care everywhere   Prostate cancer ([place]) [id number]   Umbilical hernia [id number]   asymptomatic   Wears glasses 04/16/2021    Past Surgical History:  Past Surgical History:  Procedure Laterality Date   COLONOSCOPY WITH PROPOFOL N/A 12/20/2016   Procedure: COLONOSCOPY WITH PROPOFOL;  Surgeon: Garlan Fair, MD;  Location: [facility];  Service: Endoscopy;  Laterality: N/A;   CYSTOSCOPY  04/22/2021    Procedure: CYSTOSCOPY FLEXIBLE;  Surgeon: Franchot Gallo, MD;  Location: [facility];  Service: Urology;;   FOOT SURGERY Bilateral    HERNIA REPAIR     inguinal   RADIOACTIVE SEED IMPLANT N/A 04/22/2021   Procedure: RADIOACTIVE SEED IMPLANT/BRACHYTHERAPY IMPLANT;  Surgeon: Franchot Gallo, MD;  Location: [facility];  Service: Urology;  Laterality: N/A;  69 seeds implanted   SPACE OAR INSTILLATION N/A 04/22/2021   Procedure: SPACE OAR INSTILLATION;  Surgeon: Franchot Gallo, MD;  Location: [facility];  Service: Urology;  Laterality: N/A;    Medication: Current Facility-Administered Medications  Medication Dose Route Frequency Provider Last Rate Last Admin   0.9 %  sodium chloride infusion   Intravenous Continuous Howington, Einar Pheasant, MD 100 mL/hr at 05/26/21 0537 New Bag at 05/26/21 0537   acetaminophen (TYLENOL) tablet 650 mg  650 mg Oral Q6H PRN Jacqlyn Krauss, MD   650 mg at 05/26/21 0344   Or   acetaminophen (TYLENOL) suppository 650 mg  650 mg Rectal Q6H PRN Howington, Einar Pheasant, MD       atorvastatin (LIPITOR) tablet 40 mg  40 mg Oral Daily Howington, Einar Pheasant, MD       bisacodyl (DULCOLAX) EC tablet 5 mg  5 mg Oral Daily PRN Howington, Einar Pheasant, MD       cefTRIAXone (ROCEPHIN) 1 g in sodium chloride 0.9 % 100 mL IVPB  1 g Intravenous Q24H Howington, Einar Pheasant, MD       docusate sodium (COLACE) capsule 100 mg  100 mg Oral BID  Howington, Einar Pheasant, MD   100 mg at 05/25/21 2153   HYDROcodone-acetaminophen (NORCO/VICODIN) 5-325 MG per tablet 1-2 tablet  1-2 tablet Oral Q4H PRN Howington, Einar Pheasant, MD       HYDROmorphone (DILAUDID) injection 0.5-1 mg  0.5-1 mg Intravenous Q2H PRN Howington, Einar Pheasant, MD   0.5 mg at 05/26/21 [id number]   insulin aspart (novoLOG) injection 0-9 Units  0-9 Units Subcutaneous TID WC Howington, Einar Pheasant, MD       ondansetron [facility]) tablet 4 mg  4 mg Oral Q6H PRN Howington, Einar Pheasant, MD       Or   ondansetron [facility])  injection 4 mg  4 mg Intravenous Q6H PRN Howington, Einar Pheasant, MD   4 mg at 05/26/21 0138   ondansetron (ZOFRAN-ODT) disintegrating tablet 4 mg  4 mg Oral Q8H PRN Howington, Einar Pheasant, MD       senna [facility]) tablet 8.6 mg  1 tablet Oral BID Howington, Einar Pheasant, MD   8.6 mg at 05/25/21 2153   tamsulosin (FLOMAX) capsule 0.4 mg  0.4 mg Oral Daily Howington, Einar Pheasant, MD        Allergies: No Known Allergies  Social History: Social History   Tobacco Use   Smoking status: Former    Packs/day: 1.00    Years: 6.00    Pack years: 6.00    Types: Cigarettes    Quit date: 10/04/1975    Years since quitting: 45.6   Smokeless tobacco: Never  Vaping Use   Vaping Use: Never used  Substance Use Topics   Alcohol use: Yes    Comment: daily 1-2, pt states he has not drank in 2 weeks on 05/25/21   Drug use: No    Family History Family History  Problem Relation Age of Onset   Colon cancer Mother    Colon cancer Father    Prostate cancer Paternal Uncle    Head & neck cancer Paternal Uncle    Breast cancer Neg Hx    Pancreatic cancer Neg Hx     Review of Systems 10 systems were reviewed and are negative except as noted specifically in the HPI.  Objective   Vital signs in last 24 hours: BP (!) 108/53 (BP Location: Right Arm)   Pulse 78   Temp 98.4 F (36.9 C) (Oral)   Resp 14   Ht '5\' 7"'$  (1.702 m)   Wt 68 kg   SpO2 97%   BMI 23.49 kg/m   Physical Exam General: NAD, A&O, resting, appropriate HEENT: [place]/AT, EOMI, MMM Pulmonary: Normal work of breathing Cardiovascular: HDS, adequate peripheral perfusion Abdomen: Soft, NTTP, moderately distended. GU: Circumcised phallus and well formed scrotum without lesions or masses Extremities: warm and well perfused Neuro: Appropriate, no focal neurological deficits  Most Recent Labs: Lab Results  Component Value Date   WBC 13.7 (H) 05/26/2021   HGB 11.8 (L) 05/26/2021   HCT 35.9 (L) 05/26/2021   PLT 75 (L) 05/26/2021    Lab Results   Component Value Date   NA 136 05/25/2021   K 3.6 05/25/2021   CL 102 05/25/2021   CO2 24 05/25/2021   BUN 24 (H) 05/25/2021   CREATININE 1.21 05/25/2021   CALCIUM 8.7 (L) 05/25/2021    Lab Results  Component Value Date   INR 1.0 04/20/2021   APTT 32 04/20/2021     Urine Culture: '@[email or web address]'$ (laburin,org,[id number],[id number])@   IMAGING: DG Chest Port 1 View  Result Date: 05/25/2021 CLINICAL DATA:  Ankle pain.  Brachytherapy  seed implants EXAM: PORTABLE CHEST 1 VIEW COMPARISON:  None. FINDINGS: Normal mediastinum and cardiac silhouette. Normal pulmonary vasculature. Mild LEFT basilar atelectasis. No evidence of effusion, infiltrate, or pneumothorax. No acute bony abnormality. IMPRESSION: Mild LEFT basilar atelectasis. Electronically Signed   By: Suzy Bouchard M.D.   On: 05/25/2021 10:19    ------  Assessment:  65 y.o. male with Hx of PCa s/p brachytherapy seeds and SpaceOAR injection (04/22/21) who presents w/ fevers, chills, dysuria (concerning for UTI) and constipation w/ rectal pain.   Recommendations: - Follow up Urine culture and blood cultures and treat UTI w/ 10 days of culture specific antibiotics if confirmed - Agree w/ aggressive bowel regimen to assist w/ bowel movement - Continue Flomax 0.4 mg BID for obstructive and irritative voiding symptoms - If continues to fever (>38.5) would recommend repeat CT A/P for repeat evaluation of intra-abdominal/pelvic infectious process   Thank you for this consult. Please contact the urology consult pager with any further questions/concerns.   I saw anti this morning.  I agree with the above plan, but with him getting worse over the past 2-3 days since his CT scan on Sunday, I have recommended and ordered a contrasted CT abdomen and pelvis to evaluate his rectum.

## 2021-05-26 NOTE — Progress Notes (Signed)
PROGRESS NOTE    Casey Wilcox  [id number] DOB: May 30, 1956 DOA: 05/25/2021 PCP: Lavone Orn, MD     Brief Narrative:  Casey Wilcox is a 65 y.o. male with medical history significant of prostate cancer and prostate pain from seeds planted on April 22, 2021.  Patient also had some decreased p.o. intake, subjective fevers and chills.  CT from 2 days ago showing severe sigmoid diverticulosis no obstruction but enlarged prostate gland with brachytherapy seeds high attenuating area posterior to the prostate and anterior to the rectum of indeterminate etiology.  Patient was given Rocephin and Dilaudid. White count 12.9.  UA showing large leuks but no nitrites rare bacteria.  Chest x-ray showed mild left basilar atelectasis.    New events last 24 hours / Subjective: Patient states that he had a bowel movement this morning, still having dysuria.  Assessment & Plan:   Principal Problem:   Dysuria Active Problems:   Hypercholesterolemia   Type 2 diabetes mellitus without complications (HCC)   Fever   Constipation   UTI, present on admission -Blood cultures pending  -Urine culture pending -Urology following -Continue rocephin   History of prostate cancer status post radiation seeding -Continue Flomax -CT abdomen pelvis to evaluate his rectum ordered by urology today  Constipation -Resolved, continue bowel regimen  Hyperlipidemia -Continue lipitor   Diabetes mellitus -Ha1c 8.0  -Continue sliding scale insulin  DVT prophylaxis:  SCDs Start: 05/25/21 1810  Code Status:     Code Status Orders  (From admission, onward)           Start     Ordered   05/25/21 1811  Full code  Continuous        05/25/21 1812           Code Status History     This patient has a current code status but no historical code status.      Advance Directive Documentation    Flowsheet Row Most Recent Value  Type of Advance Directive Healthcare Power of Attorney, Living will   Pre-existing out of facility DNR order (yellow form or pink MOST form) --  "MOST" Form in Place? --      Family Communication: none at bedside Disposition Plan:  Status is: Inpatient  Remains inpatient appropriate because:Inpatient level of care appropriate due to severity of illness  Dispo: The patient is from: Home              Anticipated d/c is to: Home              Patient currently is not medically stable to d/c.   Difficult to place patient No      Consultants:  Urology   Procedures:  None   Antimicrobials:  Anti-infectives (From admission, onward)    Start     Dose/Rate Route Frequency Ordered Stop   05/26/21 1000  cefTRIAXone (ROCEPHIN) 1 g in sodium chloride 0.9 % 100 mL IVPB        1 g 200 mL/hr over 30 Minutes Intravenous Every 24 hours 05/25/21 1817     05/25/21 1300  cefTRIAXone (ROCEPHIN) 1 g in sodium chloride 0.9 % 100 mL IVPB        1 g 200 mL/hr over 30 Minutes Intravenous  Once 05/25/21 1252 05/25/21 1337        Objective: Vitals:   05/25/21 2121 05/26/21 0123 05/26/21 0337 05/26/21 0531  BP: 114/64 116/74  (!) 108/53  Pulse: 87 79  78  Resp: 14 14  14  Temp: 98.4 F (36.9 C) 98.2 F (36.8 C) 99.7 F (37.6 C) 98.4 F (36.9 C)  TempSrc: Oral Oral Oral Oral  SpO2: 96% 98%  97%  Weight:      Height:        Intake/Output Summary (Last 24 hours) at 05/26/2021 1317 Last data filed at 05/26/2021 1301 Gross per 24 hour  Intake 1759.72 ml  Output 800 ml  Net 959.72 ml   Filed Weights   05/25/21 0906  Weight: 68 kg    Examination:  General exam: Appears calm and comfortable  Respiratory system: Clear to auscultation. Respiratory effort normal. No respiratory distress. No conversational dyspnea.  Cardiovascular system: S1 & S2 heard, RRR. No murmurs. No pedal edema. Gastrointestinal system: Abdomen is nondistended, soft and nontender. Normal bowel sounds heard. Central nervous system: Alert and oriented. No focal neurological  deficits. Speech clear.  Extremities: Symmetric in appearance  Skin: No rashes, lesions or ulcers on exposed skin  Psychiatry: Judgement and insight appear normal. Mood & affect appropriate.   Data Reviewed: I have personally reviewed following labs and imaging studies  CBC: Recent Labs  Lab 05/23/21 1610 05/25/21 1035 05/26/21 0536  WBC 13.8* 12.9* 13.7*  NEUTROABS 11.9* 10.7*  --   HGB 12.4* 12.1* 11.8*  HCT 35.7* 36.1* 35.9*  MCV 77.1* 78.5* 81.2  PLT 96* 78* 75*   Basic Metabolic Panel: Recent Labs  Lab 05/23/21 1610 05/25/21 1035 05/26/21 0536  NA 133* 136 137  K 3.6 3.6 4.2  CL 99 102 104  CO2 '24 24 25  '$ GLUCOSE 214* 188* 115*  BUN 36* 24* 24*  CREATININE 1.62* 1.21 1.13  CALCIUM 8.5* 8.7* 8.5*   GFR: Estimated Creatinine Clearance: 60.9 mL/min (by C-G formula based on SCr of 1.13 mg/dL). Liver Function Tests: Recent Labs  Lab 05/23/21 1610 05/25/21 1035 05/26/21 0536  AST 24 35 25  ALT 22 37 32  ALKPHOS 141* 221* 207*  BILITOT 1.3* 1.4* 1.3*  PROT 6.1* 6.4* 5.9*  ALBUMIN 3.2* 3.1* 2.6*   No results for input(s): LIPASE, AMYLASE in the last 168 hours. No results for input(s): AMMONIA in the last 168 hours. Coagulation Profile: No results for input(s): INR, PROTIME in the last 168 hours. Cardiac Enzymes: No results for input(s): CKTOTAL, CKMB, CKMBINDEX, TROPONINI in the last 168 hours. BNP (last 3 results) No results for input(s): PROBNP in the last 8760 hours. HbA1C: Recent Labs    05/26/21 0536  HGBA1C 8.0*   CBG: Recent Labs  Lab 05/25/21 1837 05/25/21 2123 05/26/21 0740 05/26/21 1133  GLUCAP 143* 156* 128* 191*   Lipid Profile: No results for input(s): CHOL, HDL, LDLCALC, TRIG, CHOLHDL, LDLDIRECT in the last 72 hours. Thyroid Function Tests: No results for input(s): TSH, T4TOTAL, FREET4, T3FREE, THYROIDAB in the last 72 hours. Anemia Panel: No results for input(s): VITAMINB12, FOLATE, FERRITIN, TIBC, IRON, RETICCTPCT in the last  72 hours. Sepsis Labs: Recent Labs  Lab 05/23/21 1610 05/25/21 1035 05/25/21 1206  LATICACIDVEN 0.9 1.0 0.7    Recent Results (from the past 240 hour(s))  Resp Panel by RT-PCR (Flu A&B, Covid) Nasopharyngeal Swab     Status: None   Collection Time: 05/23/21  9:43 PM   Specimen: Nasopharyngeal Swab; Nasopharyngeal(NP) swabs in vial transport medium  Result Value Ref Range Status   SARS Coronavirus 2 by RT PCR NEGATIVE NEGATIVE Final    Comment: (NOTE) SARS-CoV-2 target nucleic acids are NOT DETECTED.  The SARS-CoV-2 RNA is generally detectable in  upper respiratory specimens during the acute phase of infection. The lowest concentration of SARS-CoV-2 viral copies this assay can detect is 138 copies/mL. A negative result does not preclude SARS-Cov-2 infection and should not be used as the sole basis for treatment or other patient management decisions. A negative result may occur with  improper specimen collection/handling, submission of specimen other than nasopharyngeal swab, presence of viral mutation(s) within the areas targeted by this assay, and inadequate number of viral copies(<138 copies/mL). A negative result must be combined with clinical observations, patient history, and epidemiological information. The expected result is Negative.  Fact Sheet for Patients:  EntrepreneurPulse.com.au  Fact Sheet for Healthcare Providers:  IncredibleEmployment.be  This test is no t yet approved or cleared by the [place] FDA and  has been authorized for detection and/or diagnosis of SARS-CoV-2 by FDA under an Emergency Use Authorization (EUA). This EUA will remain  in effect (meaning this test can be used) for the duration of the COVID-19 declaration under Section 564(b)(1) of the Act, 21 U.S.C.section 360bbb-3(b)(1), unless the authorization is terminated  or revoked sooner.       Influenza A by PCR NEGATIVE NEGATIVE Final   Influenza B  by PCR NEGATIVE NEGATIVE Final    Comment: (NOTE) The Xpert Xpress SARS-CoV-2/FLU/RSV plus assay is intended as an aid in the diagnosis of influenza from Nasopharyngeal swab specimens and should not be used as a sole basis for treatment. Nasal washings and aspirates are unacceptable for Xpert Xpress SARS-CoV-2/FLU/RSV testing.  Fact Sheet for Patients: EntrepreneurPulse.com.au  Fact Sheet for Healthcare Providers: IncredibleEmployment.be  This test is not yet approved or cleared by the [place] FDA and has been authorized for detection and/or diagnosis of SARS-CoV-2 by FDA under an Emergency Use Authorization (EUA). This EUA will remain in effect (meaning this test can be used) for the duration of the COVID-19 declaration under Section 564(b)(1) of the Act, 21 U.S.C. section 360bbb-3(b)(1), unless the authorization is terminated or revoked.  Performed at [facility], [address] [place], [place] [address]   Culture, blood (routine x 2)     Status: None (Preliminary result)   Collection Time: 05/25/21 10:35 AM   Specimen: Right Antecubital; Blood  Result Value Ref Range Status   Specimen Description   Final    RIGHT ANTECUBITAL Performed at [facility], [address], [place], [place] [address]    Special Requests   Final    Blood Culture adequate volume Performed at [facility], [address], [place], [place] [address]    Culture   Final    NO GROWTH < 24 HOURS Performed at [facility], [place] [address], [place], [place] [address]    Report Status PENDING  Incomplete  Culture, blood (routine x 2)     Status: None (Preliminary result)   Collection Time: 05/25/21 10:35 AM   Specimen: Left Antecubital; Blood  Result Value Ref Range Status   Specimen Description   Final    LEFT ANTECUBITAL Performed at [facility], [address]  [place], [place] [address]    Special Requests   Final    Blood Culture adequate volume Performed at [facility], [address], [place], [place] [address]    Culture   Final    NO GROWTH < 24 HOURS Performed at [facility], [place] [address], [place], [place] [address]    Report Status PENDING  Incomplete  SARS CORONAVIRUS 2 (TAT 6-24  HRS) Nasopharyngeal     Status: None   Collection Time: 05/25/21 10:35 AM   Specimen: Nasopharyngeal  Result Value Ref Range Status   SARS Coronavirus 2 NEGATIVE NEGATIVE Final    Comment: (NOTE) SARS-CoV-2 target nucleic acids are NOT DETECTED.  The SARS-CoV-2 RNA is generally detectable in upper and lower respiratory specimens during the acute phase of infection. Negative results do not preclude SARS-CoV-2 infection, do not rule out co-infections with other pathogens, and should not be used as the sole basis for treatment or other patient management decisions. Negative results must be combined with clinical observations, patient history, and epidemiological information. The expected result is Negative.  Fact Sheet for Patients: SugarRoll.be  Fact Sheet for Healthcare Providers: https://www.woods-mathews.com/  This test is not yet approved or cleared by the [place] FDA and  has been authorized for detection and/or diagnosis of SARS-CoV-2 by FDA under an Emergency Use Authorization (EUA). This EUA will remain  in effect (meaning this test can be used) for the duration of the COVID-19 declaration under Se ction 564(b)(1) of the Act, 21 U.S.C. section 360bbb-3(b)(1), unless the authorization is terminated or revoked sooner.  Performed at [facility], [place] [address] [place], [place] [address]       Radiology Studies: DG Chest Port 1 View  Result Date: 05/25/2021 CLINICAL DATA:  Ankle pain.  Brachytherapy seed implants EXAM: PORTABLE CHEST 1 VIEW COMPARISON:  None.  FINDINGS: Normal mediastinum and cardiac silhouette. Normal pulmonary vasculature. Mild LEFT basilar atelectasis. No evidence of effusion, infiltrate, or pneumothorax. No acute bony abnormality. IMPRESSION: Mild LEFT basilar atelectasis. Electronically Signed   By: Suzy Bouchard M.D.   On: 05/25/2021 10:19      Scheduled Meds:  atorvastatin  40 mg Oral Daily   docusate sodium  100 mg Oral BID   insulin aspart  0-9 Units Subcutaneous TID WC   iohexol       senna  1 tablet Oral BID   tamsulosin  0.4 mg Oral BID   Continuous Infusions:  sodium chloride 100 mL/hr at 05/26/21 1301   cefTRIAXone (ROCEPHIN)  IV Stopped (05/26/21 1055)     LOS: 1 day      Time spent: 25 minutes   Dessa Phi, DO [facility] 05/26/2021, 1:17 PM   Available via Epic secure chat 7am-7pm After these hours, please refer to coverage provider listed on [email or web address]

## 2021-05-27 DIAGNOSIS — R300 Dysuria: Secondary | ICD-10-CM | POA: Diagnosis not present

## 2021-05-27 LAB — CBC
HCT: 34 % — ABNORMAL LOW (ref 39.0–52.0)
Hemoglobin: 11.2 g/dL — ABNORMAL LOW (ref 13.0–17.0)
MCH: 26.4 pg (ref 26.0–34.0)
MCHC: 32.9 g/dL (ref 30.0–36.0)
MCV: 80.2 fL (ref 80.0–100.0)
Platelets: 96 10*3/uL — ABNORMAL LOW (ref 150–400)
RBC: 4.24 MIL/uL (ref 4.22–5.81)
RDW: 14.1 % (ref 11.5–15.5)
WBC: 12.6 10*3/uL — ABNORMAL HIGH (ref 4.0–10.5)
nRBC: 0 % (ref 0.0–0.2)

## 2021-05-27 LAB — URINE CULTURE: Culture: NO GROWTH

## 2021-05-27 LAB — BASIC METABOLIC PANEL
Anion gap: 6 (ref 5–15)
BUN: 18 mg/dL (ref 8–23)
CO2: 26 mmol/L (ref 22–32)
Calcium: 8.6 mg/dL — ABNORMAL LOW (ref 8.9–10.3)
Chloride: 106 mmol/L (ref 98–111)
Creatinine, Ser: 1.13 mg/dL (ref 0.61–1.24)
GFR, Estimated: >60 mL/min (ref >60)
Glucose, Bld: 148 mg/dL — ABNORMAL HIGH (ref 70–99)
Potassium: 4 mmol/L (ref 3.5–5.1)
Sodium: 138 mmol/L (ref 135–145)

## 2021-05-27 LAB — GLUCOSE, CAPILLARY
Glucose-Capillary: 136 mg/dL — ABNORMAL HIGH (ref 70–99)
Glucose-Capillary: 166 mg/dL — ABNORMAL HIGH (ref 70–99)

## 2021-05-27 MED ORDER — AMOXICILLIN-POT CLAVULANATE 875-125 MG PO TABS: 1.0000 | ORAL_TABLET | Freq: Two times a day (BID) | ORAL | 0 refills | Status: AC

## 2021-05-27 MED ORDER — TAMSULOSIN HCL 0.4 MG PO CAPS: 0.4000 mg | ORAL_CAPSULE | Freq: Two times a day (BID) | ORAL | 0 refills | Status: AC

## 2021-05-27 MED ORDER — METRONIDAZOLE 500 MG/100ML IV SOLN
500.0000 mg | Freq: Two times a day (BID) | INTRAVENOUS | Status: DC
  Administered 2021-05-27: 500 mg via INTRAVENOUS
  Filled 2021-05-27: qty 100

## 2021-05-27 NOTE — Discharge Summary (Addendum)
Physician Discharge Summary  Casey Wilcox [id number] DOB: 04/01/1956 DOA: 05/25/2021  PCP: Lavone Orn, MD  Admit date: 05/25/2021 Discharge date: 05/27/2021  Admitted From: home Disposition:  home  Recommendations for Outpatient Follow-up:  Follow up with urology  Discharge Condition: Stable CODE STATUS: Full code Diet recommendation: Carb modified diet  Brief/Interim Summary: Casey Wilcox is a 65 y.o. male with medical history significant of prostate cancer and prostate pain from seeds planted on April 22, 2021.  Patient also had some decreased p.o. intake, subjective fevers and chills.  CT from 2 days ago showing severe sigmoid diverticulosis no obstruction but enlarged prostate gland with brachytherapy seeds high attenuating area posterior to the prostate and anterior to the rectum of indeterminate etiology.  Patient was given Rocephin and Dilaudid. White count 12.9.  UA showing large leuks but no nitrites rare bacteria.  Chest x-ray showed mild left basilar atelectasis.  CT abdomen pelvis revealed fluid collection, possible abscess.  Patient symptoms continued to improve.  Patient was discharged home with oral antibiotics  Discharge Diagnoses:  Principal Problem:   Dysuria Active Problems:   Hypercholesterolemia   Type 2 diabetes mellitus without complications (HCC)   Fever   Constipation   UTI, present on admission, with concern for possible abscess at the base of penis -Blood cultures negative to date -Urine culture negative -Urology following -Continue rocephin --> Augmentin on discharge   History of prostate cancer status post radiation seeding -Continue Flomax -CT abdomen pelvis as below -Follow-up with urology as outpatient   Constipation -Resolved, continue bowel regimen  Hyperlipidemia -Continue lipitor   Diabetes mellitus -Ha1c 8.0   Discharge Instructions  Discharge Instructions     Call MD for:  difficulty breathing, headache or visual  disturbances   Complete by: As directed    Call MD for:  extreme fatigue   Complete by: As directed    Call MD for:  persistant dizziness or light-headedness   Complete by: As directed    Call MD for:  persistant nausea and vomiting   Complete by: As directed    Call MD for:  severe uncontrolled pain   Complete by: As directed    Call MD for:  temperature >100.4   Complete by: As directed    Discharge instructions   Complete by: As directed    You were cared for by a hospitalist during your hospital stay. If you have any questions about your discharge medications or the care you received while you were in the hospital after you are discharged, you can call the unit and ask to speak with the hospitalist on call if the hospitalist that took care of you is not available. Once you are discharged, your primary care physician will handle any further medical issues. Please note that NO REFILLS for any discharge medications will be authorized once you are discharged, as it is imperative that you return to your primary care physician (or establish a relationship with a primary care physician if you do not have one) for your aftercare needs so that they can reassess your need for medications and monitor your lab values.   Increase activity slowly   Complete by: As directed       Allergies as of 05/27/2021   No Known Allergies      Medication List     TAKE these medications    acetaminophen 500 MG tablet Commonly known as: TYLENOL Take 500 mg by mouth every 6 (six) hours as needed.   amoxicillin-clavulanate 875-125  MG tablet Commonly known as: AUGMENTIN Take 1 tablet by mouth 2 (two) times daily for 10 days.   atorvastatin 40 MG tablet Commonly known as: LIPITOR Take 40 mg by mouth daily.   bisacodyl 5 MG EC tablet Commonly known as: DULCOLAX Take 5 mg by mouth daily as needed for moderate constipation.   HYDROcodone-acetaminophen 5-325 MG tablet Commonly known as:  NORCO/VICODIN Take 1 tablet by mouth every 6 (six) hours as needed for moderate pain.   meloxicam 15 MG tablet Commonly known as: MOBIC Take 15 mg by mouth daily.   metFORMIN 500 MG 24 hr tablet Commonly known as: GLUCOPHAGE-XR Take 500 mg by mouth daily. With evening meal   ondansetron 4 MG disintegrating tablet Commonly known as: Zofran ODT Take 1 tablet (4 mg total) by mouth every 8 (eight) hours as needed for nausea or vomiting.   tamsulosin 0.4 MG Caps capsule Commonly known as: FLOMAX Take 1 capsule (0.4 mg total) by mouth in the morning and at bedtime. What changed: when to take this        Follow-up Information     [facility] Follow up.   Contact information: [place] [phone]               No Known Allergies  Consultations: Urology    Procedures/Studies: CT ABDOMEN PELVIS W CONTRAST  Result Date: 05/26/2021 CLINICAL DATA:  Inpatient. Prostate cancer 1 month status post brachytherapy, assess treatment response. Patient presents with rectal pain. EXAM: CT ABDOMEN AND PELVIS WITH CONTRAST TECHNIQUE: Multidetector CT imaging of the abdomen and pelvis was performed using the standard protocol following bolus administration of intravenous contrast. CONTRAST:  81m OMNIPAQUE IOHEXOL 350 MG/ML SOLN COMPARISON:  05/23/2021 CT abdomen/pelvis. FINDINGS: Lower chest: Trace dependent bilateral pleural effusions, right greater than left, with associated mild dependent bibasilar atelectasis. Tiny solid anterior right lower lobe 2 mm pulmonary nodule (series 4/image 2), unchanged from 05/23/2021 CT. Hepatobiliary: Normal liver size. No liver mass. Normal gallbladder with no radiopaque cholelithiasis. No biliary ductal dilatation. Pancreas: Normal, with no mass or duct dilation. Spleen: Mild splenomegaly. Craniocaudal splenic length 14.6 cm, stable. No splenic masses. Adrenals/Urinary Tract: Normal adrenals.  Normal kidneys with no hydronephrosis and no renal mass. Normal bladder. Stomach/Bowel: Normal non-distended stomach. Normal caliber small bowel with no small bowel wall thickening. Normal appendix. Oral contrast transits to the right colon. Moderate sigmoid diverticulosis without associated wall thickening or acute pericolonic fat stranding. There is a mixed density 4.7 x 2.7 x 4.2 cm collection interposed between the prostate and anterior lower rectum (series 2/image 77), demonstrating hyperdensity anteriorly and soft tissue density posteriorly, with mass-effect on the rectal wall, not substantially changed since 05/23/2021 CT. This retro-prostatic collection appears to communicate with a serpiginous collection along the midline inferior/posterior base of penis with associated thick enhancing wall measuring 11.4 x 5.5 x 2.5 cm (series 6/image 60), which appears mildly increased from 10.15 x 5.1 x 1.6 cm on 05/23/2021 CT. This collection may communicate with the bulbar/membranous urethra (series 6/image 59). Vascular/Lymphatic: Atherosclerotic nonaneurysmal abdominal aorta. Patent portal, splenic, hepatic and renal veins. No pathologically enlarged lymph nodes in the abdomen or pelvis. Reproductive: Mildly enlarged prostate with numerous internal brachytherapy seeds. Other: No pneumoperitoneum.  No ascites. Musculoskeletal: No aggressive appearing focal osseous lesions. Mild-to-moderate thoracolumbar spondylosis. IMPRESSION: 1. Complex mixed density collection interposed between the prostate and rectum and contiguous with a serpiginous collection along the midline posterior/inferior base of penis.  The retro-prostatic collection appears similar since 05/23/2021 CT and demonstrates hyperdense components. The base of penis collection appears mildly increased and is associated with a thick enhancing wall worrisome for abscess. This collection may communicate with the bulbar/membranous urethra and could potentially  represent the sequela of a urethral injury. 2. Trace dependent bilateral pleural effusions, right greater than left. 3. Moderate sigmoid diverticulosis. 4. Stable mild splenomegaly. 5. Aortic Atherosclerosis (ICD10-I70.0). Electronically Signed   By: Ilona Sorrel M.D.   On: 05/26/2021 18:29   CT Abdomen Pelvis W Contrast  Result Date: 05/23/2021 CLINICAL DATA:  Abdominal pain. EXAM: CT ABDOMEN AND PELVIS WITH CONTRAST TECHNIQUE: Multidetector CT imaging of the abdomen and pelvis was performed using the standard protocol following bolus administration of intravenous contrast. CONTRAST:  1m OMNIPAQUE IOHEXOL 350 MG/ML SOLN COMPARISON:  None. FINDINGS: Lower chest: Bibasilar linear atelectasis. The visualized lung bases are otherwise clear. There is calcification of the mitral annulus. No intra-abdominal free air or free fluid. Hepatobiliary: No focal liver abnormality is seen. No gallstones, gallbladder wall thickening, or biliary dilatation. Pancreas: Unremarkable. No pancreatic ductal dilatation or surrounding inflammatory changes. Spleen: Mild splenomegaly measuring 14 cm in length. Adrenals/Urinary Tract: The adrenal glands are unremarkable. There is no hydronephrosis on either side. There is symmetric enhancement and excretion of contrast by both kidneys. The visualized ureters and urinary bladder appear unremarkable. Stomach/Bowel: There is severe sigmoid diverticulosis and scattered colonic diverticula without active inflammatory changes. There is no bowel obstruction or active inflammation. The appendix is normal. Vascular/Lymphatic: Mild aortoiliac atherosclerotic disease. The IVC is unremarkable. No portal venous gas. There is no adenopathy. Reproductive: Enlarged prostate gland. Prostate brachytherapy seeds noted. The seminal vesicles are symmetric. There is a 3.7 x 1.3 cm high attenuating area posterior to the prostate and anterior to the rectum (81/2) which is of indeterminate etiology but may be  related to seed implant. Other: Small fat containing umbilical hernia. Musculoskeletal: Mild degenerative changes of the spine. No acute osseous pathology. IMPRESSION: 1. No acute intra-abdominal or pelvic pathology. 2. Severe sigmoid diverticulosis. No bowel obstruction. Normal appendix. 3. Enlarged prostate gland with brachytherapy seeds. 4. High attenuating area posterior to the prostate and anterior to the rectum of indeterminate etiology, likely related to seed implant. 5. Mild splenomegaly. 6. Aortic Atherosclerosis (ICD10-I70.0). Electronically Signed   By: AAnner CreteM.D.   On: 05/23/2021 22:53   DG Chest Port 1 View  Result Date: 05/25/2021 CLINICAL DATA:  Ankle pain.  Brachytherapy seed implants EXAM: PORTABLE CHEST 1 VIEW COMPARISON:  None. FINDINGS: Normal mediastinum and cardiac silhouette. Normal pulmonary vasculature. Mild LEFT basilar atelectasis. No evidence of effusion, infiltrate, or pneumothorax. No acute bony abnormality. IMPRESSION: Mild LEFT basilar atelectasis. Electronically Signed   By: SSuzy BouchardM.D.   On: 05/25/2021 10:19   DG Chest Port 1 View  Result Date: 05/23/2021 CLINICAL DATA:  Chills EXAM: PORTABLE CHEST 1 VIEW COMPARISON:  02/11/2021 FINDINGS: Left base linear densities, likely atelectasis. Right lung clear. Heart is normal size. No effusions or acute bony abnormality. IMPRESSION: Left base atelectasis.  No active disease. Electronically Signed   By: KRolm BaptiseM.D.   On: 05/23/2021 22:07       Discharge Exam: Vitals:   05/26/21 2107 05/27/21 0442  BP: 137/66 114/65  Pulse: 88 77  Resp: 18 16  Temp: 99.2 F (37.3 C) 98.5 F (36.9 C)  SpO2: 93% 91%    General: Pt is alert, awake, not in acute distress Cardiovascular: RRR, S1/S2 +, no  edema Respiratory: CTA bilaterally, no wheezing, no rhonchi, no respiratory distress, no conversational dyspnea  Abdominal: Soft, NT, ND, bowel sounds + Extremities: no edema, no cyanosis Psych: Normal mood  and affect, stable judgement and insight     The results of significant diagnostics from this hospitalization (including imaging, microbiology, ancillary and laboratory) are listed below for reference.     Microbiology: Recent Results (from the past 240 hour(s))  Resp Panel by RT-PCR (Flu A&B, Covid) Nasopharyngeal Swab     Status: None   Collection Time: 05/23/21  9:43 PM   Specimen: Nasopharyngeal Swab; Nasopharyngeal(NP) swabs in vial transport medium  Result Value Ref Range Status   SARS Coronavirus 2 by RT PCR NEGATIVE NEGATIVE Final    Comment: (NOTE) SARS-CoV-2 target nucleic acids are NOT DETECTED.  The SARS-CoV-2 RNA is generally detectable in upper respiratory specimens during the acute phase of infection. The lowest concentration of SARS-CoV-2 viral copies this assay can detect is 138 copies/mL. A negative result does not preclude SARS-Cov-2 infection and should not be used as the sole basis for treatment or other patient management decisions. A negative result may occur with  improper specimen collection/handling, submission of specimen other than nasopharyngeal swab, presence of viral mutation(s) within the areas targeted by this assay, and inadequate number of viral copies(<138 copies/mL). A negative result must be combined with clinical observations, patient history, and epidemiological information. The expected result is Negative.  Fact Sheet for Patients:  EntrepreneurPulse.com.au  Fact Sheet for Healthcare Providers:  IncredibleEmployment.be  This test is no t yet approved or cleared by the [place] FDA and  has been authorized for detection and/or diagnosis of SARS-CoV-2 by FDA under an Emergency Use Authorization (EUA). This EUA will remain  in effect (meaning this test can be used) for the duration of the COVID-19 declaration under Section 564(b)(1) of the Act, 21 U.S.C.section 360bbb-3(b)(1), unless the  authorization is terminated  or revoked sooner.       Influenza A by PCR NEGATIVE NEGATIVE Final   Influenza B by PCR NEGATIVE NEGATIVE Final    Comment: (NOTE) The Xpert Xpress SARS-CoV-2/FLU/RSV plus assay is intended as an aid in the diagnosis of influenza from Nasopharyngeal swab specimens and should not be used as a sole basis for treatment. Nasal washings and aspirates are unacceptable for Xpert Xpress SARS-CoV-2/FLU/RSV testing.  Fact Sheet for Patients: EntrepreneurPulse.com.au  Fact Sheet for Healthcare Providers: IncredibleEmployment.be  This test is not yet approved or cleared by the [place] FDA and has been authorized for detection and/or diagnosis of SARS-CoV-2 by FDA under an Emergency Use Authorization (EUA). This EUA will remain in effect (meaning this test can be used) for the duration of the COVID-19 declaration under Section 564(b)(1) of the Act, 21 U.S.C. section 360bbb-3(b)(1), unless the authorization is terminated or revoked.  Performed at [facility], [address] [place], [place] [address]   Culture, blood (routine x 2)     Status: None (Preliminary result)   Collection Time: 05/25/21 10:35 AM   Specimen: Right Antecubital; Blood  Result Value Ref Range Status   Specimen Description   Final    RIGHT ANTECUBITAL Performed at [facility], [address], [place], [place] [address]    Special Requests   Final    Blood Culture adequate volume Performed at [facility], [address] [place], [place] [address]    Culture   Final    NO GROWTH 2 DAYS Performed at [facility], [address] [place], [place] [address]    Report Status PENDING  Incomplete  Culture, blood (routine x 2)     Status: None (Preliminary result)   Collection Time: 05/25/21 10:35 AM   Specimen: Left Antecubital; Blood  Result Value Ref Range Status   Specimen  Description   Final    LEFT ANTECUBITAL Performed at [facility], [address] [place], [place] [address]    Special Requests   Final    Blood Culture adequate volume Performed at [facility], [address] [place], [place] [address]    Culture   Final    NO GROWTH 2 DAYS Performed at [facility], [place] [address], [place], [place] [address]    Report Status PENDING  Incomplete  SARS CORONAVIRUS 2 (TAT 6-24 HRS) Nasopharyngeal     Status: None   Collection Time: 05/25/21 10:35 AM   Specimen: Nasopharyngeal  Result Value Ref Range Status   SARS Coronavirus 2 NEGATIVE NEGATIVE Final    Comment: (NOTE) SARS-CoV-2 target nucleic acids are NOT DETECTED.  The SARS-CoV-2 RNA is generally detectable in upper and lower respiratory specimens during the acute phase of infection. Negative results do not preclude SARS-CoV-2 infection, do not rule out co-infections with other pathogens, and should not be used as the sole basis for treatment or other patient management decisions. Negative results must be combined with clinical observations, patient history, and epidemiological information. The expected result is Negative.  Fact Sheet for Patients: SugarRoll.be  Fact Sheet for Healthcare Providers: https://www.woods-mathews.com/  This test is not yet approved or cleared by the [place] FDA and  has been authorized for detection and/or diagnosis of SARS-CoV-2 by FDA under an Emergency Use Authorization (EUA). This EUA will remain  in effect (meaning this test can be used) for the duration of the COVID-19 declaration under Se ction 564(b)(1) of the Act, 21 U.S.C. section 360bbb-3(b)(1), unless the authorization is terminated or revoked sooner.  Performed at [facility], [place] [address] [place], [place] [address]   Urine Culture     Status: None   Collection Time: 05/25/21 12:29 PM    Specimen: Urine, Clean Catch  Result Value Ref Range Status   Specimen Description   Final    URINE, CLEAN CATCH Performed at [facility], [address] [place], [place] [address]    Special Requests   Final    NONE Performed at [facility], [address] [place], [place] [address]    Culture   Final    NO GROWTH Performed at [facility], [place] [address], [place], [place] [address]    Report Status 05/27/2021 FINAL  Final     Labs: BNP (last 3 results) No results for input(s): BNP in the last 8760 hours. Basic Metabolic Panel: Recent Labs  Lab 05/23/21 1610 05/25/21 1035 05/26/21 0536 05/27/21 0508  NA 133* 136 137 138  K 3.6 3.6 4.2 4.0  CL 99 102 104 106  CO2 '24 24 25 26  '$ GLUCOSE 214* 188* 115* 148*  BUN 36* 24* 24* 18  CREATININE 1.62* 1.21 1.13 1.13  CALCIUM 8.5* 8.7* 8.5* 8.6*   Liver Function Tests: Recent Labs  Lab 05/23/21 1610 05/25/21 1035 05/26/21 0536  AST 24 35 25  ALT 22 37 32  ALKPHOS 141* 221* 207*  BILITOT 1.3* 1.4* 1.3*  PROT 6.1* 6.4* 5.9*  ALBUMIN 3.2* 3.1* 2.6*   No results for input(s):  LIPASE, AMYLASE in the last 168 hours. No results for input(s): AMMONIA in the last 168 hours. CBC: Recent Labs  Lab 05/23/21 1610 05/25/21 1035 05/26/21 0536 05/27/21 0508  WBC 13.8* 12.9* 13.7* 12.6*  NEUTROABS 11.9* 10.7*  --   --   HGB 12.4* 12.1* 11.8* 11.2*  HCT 35.7* 36.1* 35.9* 34.0*  MCV 77.1* 78.5* 81.2 80.2  PLT 96* 78* 75* 96*   Cardiac Enzymes: No results for input(s): CKTOTAL, CKMB, CKMBINDEX, TROPONINI in the last 168 hours. BNP: Invalid input(s): POCBNP CBG: Recent Labs  Lab 05/26/21 0740 05/26/21 1133 05/26/21 1700 05/26/21 2109 05/27/21 0804  GLUCAP 128* 191* 209* 170* 136*   D-Dimer No results for input(s): DDIMER in the last 72 hours. Hgb A1c Recent Labs    05/26/21 0536  HGBA1C 8.0*   Lipid Profile No results for input(s): CHOL, HDL, LDLCALC, TRIG,  CHOLHDL, LDLDIRECT in the last 72 hours. Thyroid function studies No results for input(s): TSH, T4TOTAL, T3FREE, THYROIDAB in the last 72 hours.  Invalid input(s): FREET3 Anemia work up No results for input(s): VITAMINB12, FOLATE, FERRITIN, TIBC, IRON, RETICCTPCT in the last 72 hours. Urinalysis    Component Value Date/Time   COLORURINE YELLOW 05/25/2021 1229   APPEARANCEUR HAZY (A) 05/25/2021 1229   LABSPEC 1.031 (H) 05/25/2021 1229   PHURINE 5.5 05/25/2021 1229   GLUCOSEU NEGATIVE 05/25/2021 1229   HGBUR TRACE (A) 05/25/2021 1229   BILIRUBINUR NEGATIVE 05/25/2021 [place] 05/25/2021 1229   PROTEINUR 30 (A) 05/25/2021 1229   NITRITE NEGATIVE 05/25/2021 1229   LEUKOCYTESUR LARGE (A) 05/25/2021 1229   Sepsis Labs Invalid input(s): PROCALCITONIN,  WBC,  LACTICIDVEN Microbiology Recent Results (from the past 240 hour(s))  Resp Panel by RT-PCR (Flu A&B, Covid) Nasopharyngeal Swab     Status: None   Collection Time: 05/23/21  9:43 PM   Specimen: Nasopharyngeal Swab; Nasopharyngeal(NP) swabs in vial transport medium  Result Value Ref Range Status   SARS Coronavirus 2 by RT PCR NEGATIVE NEGATIVE Final    Comment: (NOTE) SARS-CoV-2 target nucleic acids are NOT DETECTED.  The SARS-CoV-2 RNA is generally detectable in upper respiratory specimens during the acute phase of infection. The lowest concentration of SARS-CoV-2 viral copies this assay can detect is 138 copies/mL. A negative result does not preclude SARS-Cov-2 infection and should not be used as the sole basis for treatment or other patient management decisions. A negative result may occur with  improper specimen collection/handling, submission of specimen other than nasopharyngeal swab, presence of viral mutation(s) within the areas targeted by this assay, and inadequate number of viral copies(<138 copies/mL). A negative result must be combined with clinical observations, patient history, and  epidemiological information. The expected result is Negative.  Fact Sheet for Patients:  EntrepreneurPulse.com.au  Fact Sheet for Healthcare Providers:  IncredibleEmployment.be  This test is no t yet approved or cleared by the [place] FDA and  has been authorized for detection and/or diagnosis of SARS-CoV-2 by FDA under an Emergency Use Authorization (EUA). This EUA will remain  in effect (meaning this test can be used) for the duration of the COVID-19 declaration under Section 564(b)(1) of the Act, 21 U.S.C.section 360bbb-3(b)(1), unless the authorization is terminated  or revoked sooner.       Influenza A by PCR NEGATIVE NEGATIVE Final   Influenza B by PCR NEGATIVE NEGATIVE Final    Comment: (NOTE) The Xpert Xpress SARS-CoV-2/FLU/RSV plus assay is intended as an aid in the diagnosis of influenza from Nasopharyngeal swab  specimens and should not be used as a sole basis for treatment. Nasal washings and aspirates are unacceptable for Xpert Xpress SARS-CoV-2/FLU/RSV testing.  Fact Sheet for Patients: EntrepreneurPulse.com.au  Fact Sheet for Healthcare Providers: IncredibleEmployment.be  This test is not yet approved or cleared by the [place] FDA and has been authorized for detection and/or diagnosis of SARS-CoV-2 by FDA under an Emergency Use Authorization (EUA). This EUA will remain in effect (meaning this test can be used) for the duration of the COVID-19 declaration under Section 564(b)(1) of the Act, 21 U.S.C. section 360bbb-3(b)(1), unless the authorization is terminated or revoked.  Performed at [facility], [address] [place], [place] [address]   Culture, blood (routine x 2)     Status: None (Preliminary result)   Collection Time: 05/25/21 10:35 AM   Specimen: Right Antecubital; Blood  Result Value Ref Range Status   Specimen Description   Final    RIGHT  ANTECUBITAL Performed at [facility], [address], [place], [place] [address]    Special Requests   Final    Blood Culture adequate volume Performed at [facility], [address] [place], [place] [address]    Culture   Final    NO GROWTH 2 DAYS Performed at [facility], [place] [address], [place], [place] [address]    Report Status PENDING  Incomplete  Culture, blood (routine x 2)     Status: None (Preliminary result)   Collection Time: 05/25/21 10:35 AM   Specimen: Left Antecubital; Blood  Result Value Ref Range Status   Specimen Description   Final    LEFT ANTECUBITAL Performed at [facility], [address], [place], [place] [address]    Special Requests   Final    Blood Culture adequate volume Performed at [facility], [address] [place], [place] [address]    Culture   Final    NO GROWTH 2 DAYS Performed at [facility], [place] [address], [place], [place] [address]    Report Status PENDING  Incomplete  SARS CORONAVIRUS 2 (TAT 6-24 HRS) Nasopharyngeal     Status: None   Collection Time: 05/25/21 10:35 AM   Specimen: Nasopharyngeal  Result Value Ref Range Status   SARS Coronavirus 2 NEGATIVE NEGATIVE Final    Comment: (NOTE) SARS-CoV-2 target nucleic acids are NOT DETECTED.  The SARS-CoV-2 RNA is generally detectable in upper and lower respiratory specimens during the acute phase of infection. Negative results do not preclude SARS-CoV-2 infection, do not rule out co-infections with other pathogens, and should not be used as the sole basis for treatment or other patient management decisions. Negative results must be combined with clinical observations, patient history, and epidemiological information. The expected result is Negative.  Fact Sheet for Patients: SugarRoll.be  Fact Sheet for Healthcare  Providers: https://www.woods-mathews.com/  This test is not yet approved or cleared by the [place] FDA and  has been authorized for detection and/or diagnosis of SARS-CoV-2 by FDA under an Emergency Use Authorization (EUA). This EUA will remain  in effect (meaning this test can be used) for the duration of the COVID-19 declaration under Se ction 564(b)(1) of the Act, 21 U.S.C. section 360bbb-3(b)(1), unless the authorization is terminated or revoked sooner.  Performed at [facility], [place] [address] [place], [place] [address]   Urine Culture     Status: None   Collection Time: 05/25/21 12:29 PM   Specimen: Urine, Clean Catch  Result Value Ref Range Status   Specimen Description   Final    URINE, CLEAN CATCH Performed at [facility], [address], [place], [place] [address]    Special Requests   Final    NONE Performed at [facility], [address], [place], [place] [address]    Culture   Final    NO GROWTH Performed at [facility], [place] [address] [place], [place] [address]    Report Status 05/27/2021 FINAL  Final     Patient was seen and examined on the day of discharge and was found to be in stable condition. Time coordinating discharge: 25 minutes including assessment and coordination of care, as well as examination of the patient.   SIGNED:  Dessa Phi, DO [facility] 05/27/2021, 12:52 PM

## 2021-05-27 NOTE — Consult Note (Signed)
Urology Consult Note   Requesting Attending Physician:  Dessa Phi, DO Service Providing Consult: Urology  Consulting Attending: Dr. Franchot Gallo   Reason for Consult:  Fevers, chills, dysuria, rectal pain  Interval/Subjective: - NAEON - AF, HDS - Tolerating regular diet well - Voiding spontaneously, much improved dysuria - Had a BM. Still loose, but feeling much better afterwards.  - Rectal pain resolved. - Labs stable - CT w/ stable complex collection between prostate and rectum extending towards the base of the penis  Objective   Vital signs in last 24 hours: BP 114/65 (BP Location: Right Arm)   Pulse 77   Temp 98.5 F (36.9 C) (Oral)   Resp 16   Ht '5\' 7"'$  (1.702 m)   Wt 68 kg   SpO2 91%   BMI 23.49 kg/m   Physical Exam General: NAD, A&O, resting, appropriate HEENT: [place]/AT, EOMI, MMM Pulmonary: Normal work of breathing Cardiovascular: HDS, adequate peripheral perfusion Abdomen: Soft, NTTP, moderately distended. GU: Circumcised phallus and well formed scrotum without lesions or masses. No palpable induration, erythema, drainage, or tenderness along the inferior shaft of the penis, base of the penis, or perineum. Extremities: warm and well perfused Neuro: Appropriate, no focal neurological deficits  Most Recent Labs: Lab Results  Component Value Date   WBC 12.6 (H) 05/27/2021   HGB 11.2 (L) 05/27/2021   HCT 34.0 (L) 05/27/2021   PLT 96 (L) 05/27/2021    Lab Results  Component Value Date   NA 138 05/27/2021   K 4.0 05/27/2021   CL 106 05/27/2021   CO2 26 05/27/2021   BUN 18 05/27/2021   CREATININE 1.13 05/27/2021   CALCIUM 8.6 (L) 05/27/2021    Lab Results  Component Value Date   INR 1.0 04/20/2021   APTT 32 04/20/2021    IMAGING: CT ABDOMEN PELVIS W CONTRAST  Result Date: 05/26/2021 CLINICAL DATA:  Inpatient. Prostate cancer 1 month status post brachytherapy, assess treatment response. Patient presents with rectal pain. EXAM: CT  ABDOMEN AND PELVIS WITH CONTRAST TECHNIQUE: Multidetector CT imaging of the abdomen and pelvis was performed using the standard protocol following bolus administration of intravenous contrast. CONTRAST:  41m OMNIPAQUE IOHEXOL 350 MG/ML SOLN COMPARISON:  05/23/2021 CT abdomen/pelvis. FINDINGS: Lower chest: Trace dependent bilateral pleural effusions, right greater than left, with associated mild dependent bibasilar atelectasis. Tiny solid anterior right lower lobe 2 mm pulmonary nodule (series 4/image 2), unchanged from 05/23/2021 CT. Hepatobiliary: Normal liver size. No liver mass. Normal gallbladder with no radiopaque cholelithiasis. No biliary ductal dilatation. Pancreas: Normal, with no mass or duct dilation. Spleen: Mild splenomegaly. Craniocaudal splenic length 14.6 cm, stable. No splenic masses. Adrenals/Urinary Tract: Normal adrenals. Normal kidneys with no hydronephrosis and no renal mass. Normal bladder. Stomach/Bowel: Normal non-distended stomach. Normal caliber small bowel with no small bowel wall thickening. Normal appendix. Oral contrast transits to the right colon. Moderate sigmoid diverticulosis without associated wall thickening or acute pericolonic fat stranding. There is a mixed density 4.7 x 2.7 x 4.2 cm collection interposed between the prostate and anterior lower rectum (series 2/image 77), demonstrating hyperdensity anteriorly and soft tissue density posteriorly, with mass-effect on the rectal wall, not substantially changed since 05/23/2021 CT. This retro-prostatic collection appears to communicate with a serpiginous collection along the midline inferior/posterior base of penis with associated thick enhancing wall measuring 11.4 x 5.5 x 2.5 cm (series 6/image 60), which appears mildly increased from 10.15 x 5.1 x 1.6 cm on 05/23/2021 CT. This collection may communicate with the bulbar/membranous  urethra (series 6/image 59). Vascular/Lymphatic: Atherosclerotic nonaneurysmal abdominal aorta.  Patent portal, splenic, hepatic and renal veins. No pathologically enlarged lymph nodes in the abdomen or pelvis. Reproductive: Mildly enlarged prostate with numerous internal brachytherapy seeds. Other: No pneumoperitoneum.  No ascites. Musculoskeletal: No aggressive appearing focal osseous lesions. Mild-to-moderate thoracolumbar spondylosis. IMPRESSION: 1. Complex mixed density collection interposed between the prostate and rectum and contiguous with a serpiginous collection along the midline posterior/inferior base of penis. The retro-prostatic collection appears similar since 05/23/2021 CT and demonstrates hyperdense components. The base of penis collection appears mildly increased and is associated with a thick enhancing wall worrisome for abscess. This collection may communicate with the bulbar/membranous urethra and could potentially represent the sequela of a urethral injury. 2. Trace dependent bilateral pleural effusions, right greater than left. 3. Moderate sigmoid diverticulosis. 4. Stable mild splenomegaly. 5. Aortic Atherosclerosis (ICD10-I70.0). Electronically Signed   By: Ilona Sorrel M.D.   On: 05/26/2021 18:29   DG Chest Port 1 View  Result Date: 05/25/2021 CLINICAL DATA:  Ankle pain.  Brachytherapy seed implants EXAM: PORTABLE CHEST 1 VIEW COMPARISON:  None. FINDINGS: Normal mediastinum and cardiac silhouette. Normal pulmonary vasculature. Mild LEFT basilar atelectasis. No evidence of effusion, infiltrate, or pneumothorax. No acute bony abnormality. IMPRESSION: Mild LEFT basilar atelectasis. Electronically Signed   By: Suzy Bouchard M.D.   On: 05/25/2021 10:19    ------  Assessment:  65 y.o. male with Hx of PCa s/p brachytherapy seeds and SpaceOAR injection (04/22/21) who presented w/ fevers, chills, dysuria (concerning for UTI) and constipation w/ rectal pain.  Feeling much improved w/ antibiotics and after having a BM. The CT findings could represent some extrusion of the SpaceOAR  as well as possible overlying infection; however, he appears to be responding well to conservative measures with PO antibiotics   Recommendations: - Ok for discharge from a Urology standpoint - Recommend 10 days of Augmentin BID - Continue Flomax 0.4 mg BID for obstructive and irritative voiding symptoms - Urology will reach out to the patient to set up outpatient follow up   Thank you for this consult. Please contact the urology consult pager with any further questions/concerns.

## 2021-05-30 LAB — CULTURE, BLOOD (ROUTINE X 2)
Culture: NO GROWTH
Culture: NO GROWTH
Special Requests: ADEQUATE
Special Requests: ADEQUATE

## 2021-06-04 ENCOUNTER — Ambulatory Visit
Admission: RE | Admit: 2021-06-04 | Discharge: 2021-06-04 | Disposition: A | Discharge status: Home / Self Care | Payer: Medicare Other | Source: Ambulatory Visit | Attending: Radiation Oncology | Admitting: Radiation Oncology

## 2021-06-04 ENCOUNTER — Encounter: Payer: Self-pay | Admitting: Radiation Oncology

## 2021-06-04 DIAGNOSIS — C61 Malignant neoplasm of prostate: Secondary | ICD-10-CM | POA: Diagnosis present

## 2021-06-07 NOTE — Progress Notes (Signed)
  Radiation Oncology         [phone] ________________________________  Name: Sunjay Aniello MRN: [id number]  Date: 06/04/2021  DOB: 11-Apr-1956  3D Planning Note   Prostate Brachytherapy Post-Implant Dosimetry  Diagnosis: 65 y.o. gentleman with Stage T1c adenocarcinoma of the prostate with Gleason score of 3+4, and PSA of 3.82.  Narrative: On a previous date, Dashun Kostelnik returned following prostate seed implantation for post implant planning. He underwent CT scan complex simulation to delineate the three-dimensional structures of the pelvis and demonstrate the radiation distribution.  Since that time, the seed localization, and complex isodose planning with dose volume histograms have now been completed.  Results:   Prostate Coverage - The dose of radiation delivered to the 90% or more of the prostate gland (D90) was 106.15% of the prescription dose. This exceeds our goal of greater than 90%. Rectal Sparing - The volume of rectal tissue receiving the prescription dose or higher was 0.0 cc. This falls under our thresholds tolerance of 1.0 cc.  Impression: The prostate seed implant appears to show adequate target coverage and appropriate rectal sparing.  Plan:  The patient will continue to follow with urology for ongoing PSA determinations. I would anticipate a high likelihood for local tumor control with minimal risk for rectal morbidity.  ________________________________  Sheral Apley Tammi Klippel, M.D.

## 2021-06-26 ENCOUNTER — Telehealth: Payer: Self-pay | Admitting: Adult Health

## 2021-06-26 NOTE — Telephone Encounter (Signed)
LMOM with patient to call back so we can schedule his SCP visit.    Wilber Bihari, NP

## 2021-10-12 ENCOUNTER — Telehealth: Payer: Self-pay | Admitting: *Deleted

## 2021-12-12 IMAGING — CT CT ABD-PELV W/ CM
2 of 5 series · 15 of 46 positions shown, 17 images · IV contrast (APPLIED)
Comparison: None.

CLINICAL DATA: Abdominal pain.

EXAM:
CT ABDOMEN AND PELVIS WITH CONTRAST
TECHNIQUE: Multidetector CT imaging of the abdomen and pelvis was performed
using the standard protocol following bolus administration of
intravenous contrast.
CONTRAST:  60mL OMNIPAQUE IOHEXOL 350 MG/ML SOLN

[Series 2: abd pel w · axial · 0.62mm/px · z∈[-1101,-676]mm · 12 of 96 slices shown, 14 images]
[im 6/96  soft-tissue]
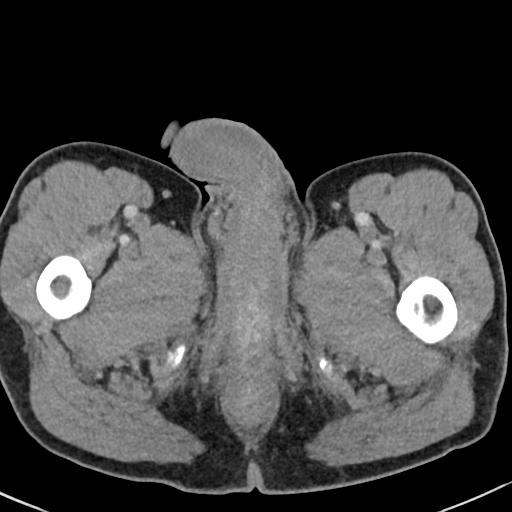
[im 6/96  bone]
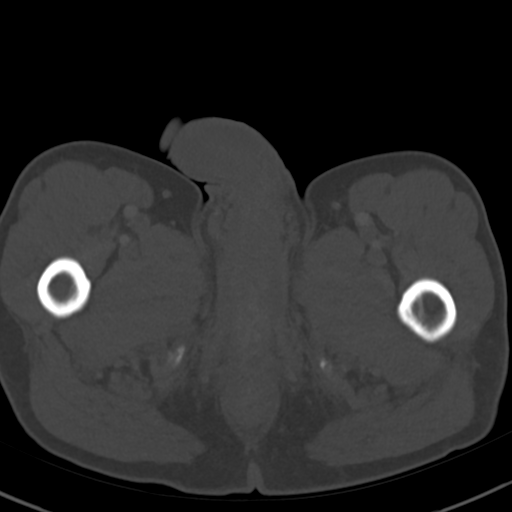
[im 16/96  soft-tissue]
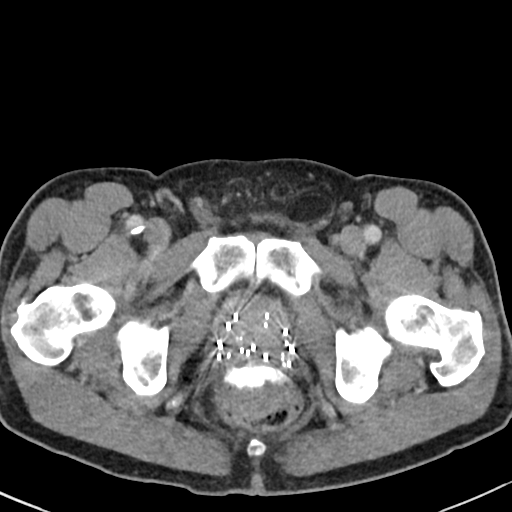
[im 21/96  soft-tissue]
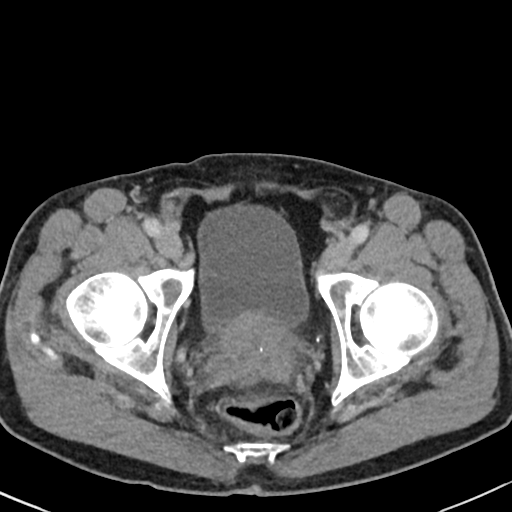
[im 31/96  soft-tissue]
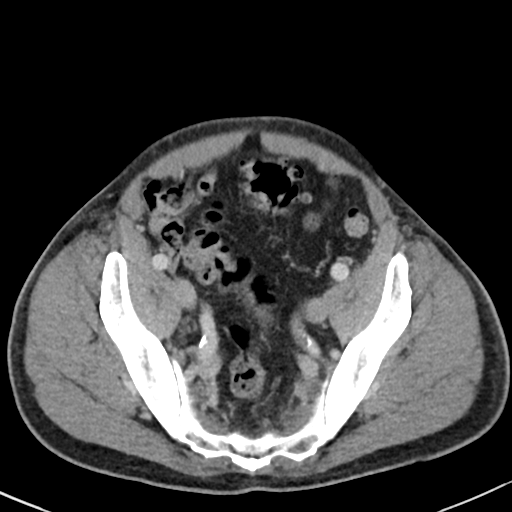
[im 36/96  soft-tissue]
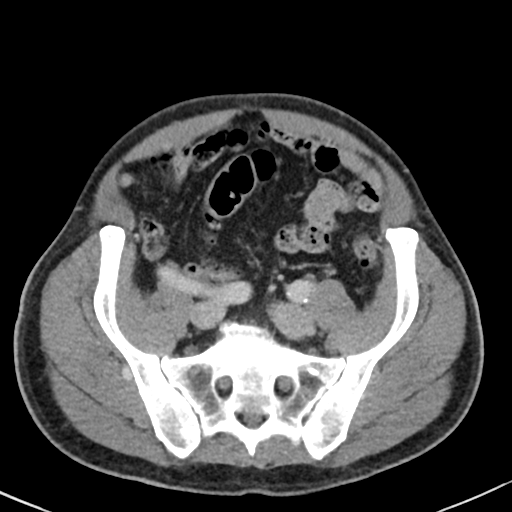
[im 46/96  soft-tissue]
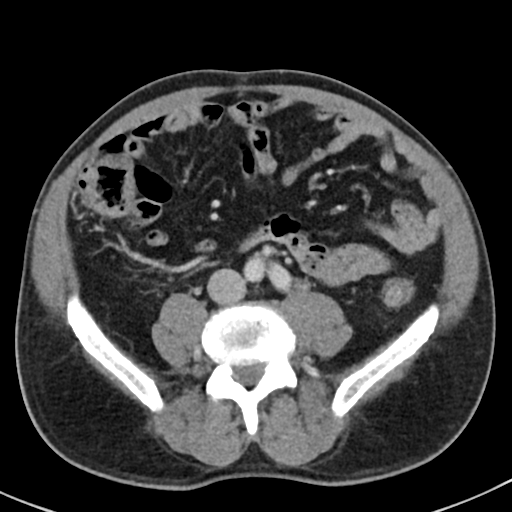
[im 51/96  soft-tissue]
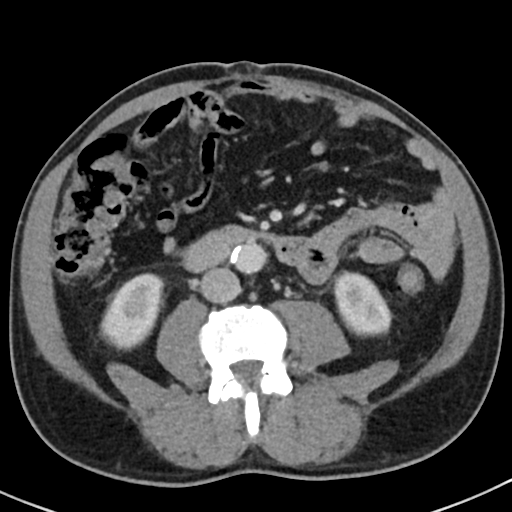
[im 61/96  soft-tissue]
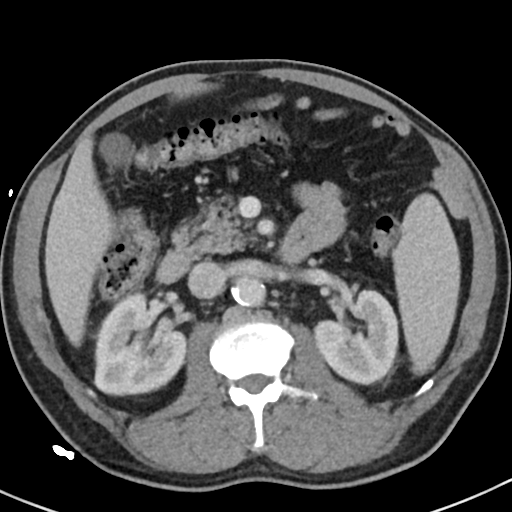
[im 66/96  soft-tissue]
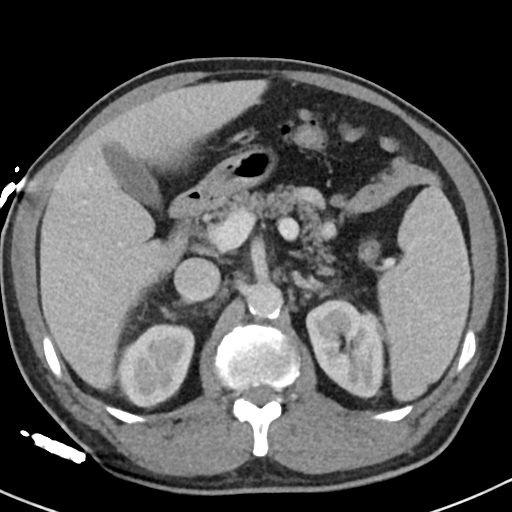
[im 66/96  bone]
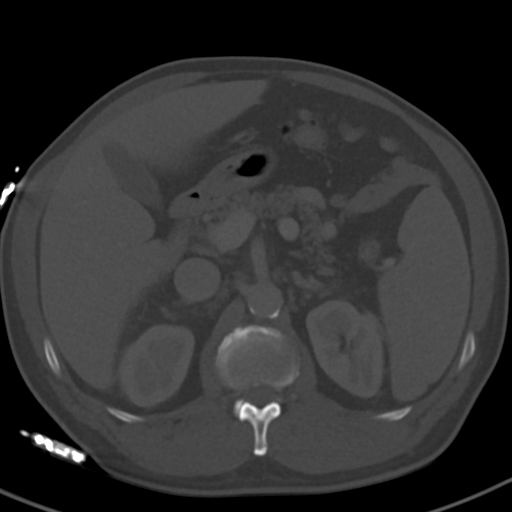
[im 76/96  soft-tissue]
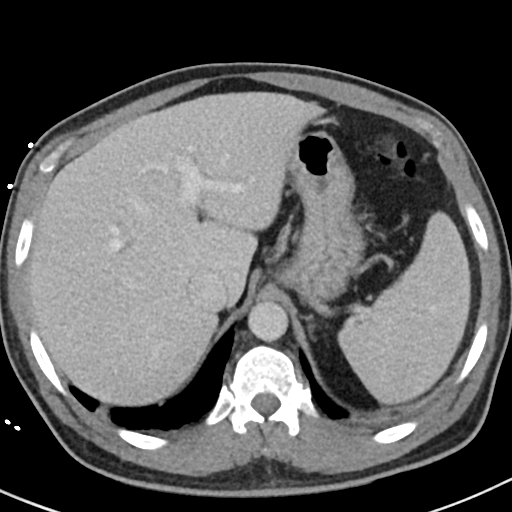
[im 81/96  soft-tissue]
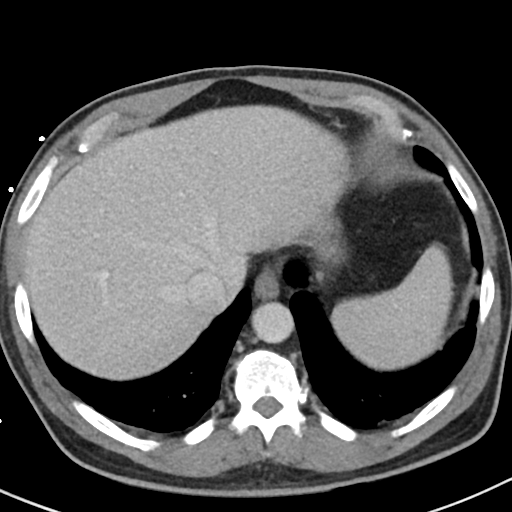
[im 91/96  soft-tissue]
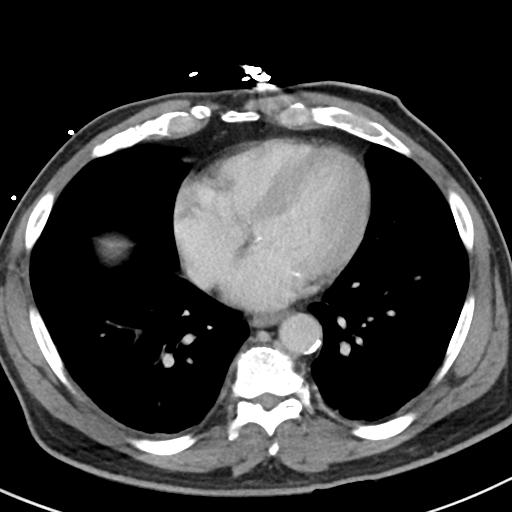

[Series 5: coronal · coronal · 0.68mm/px · 3 of 94 slices shown]
[im 32/94  soft-tissue]
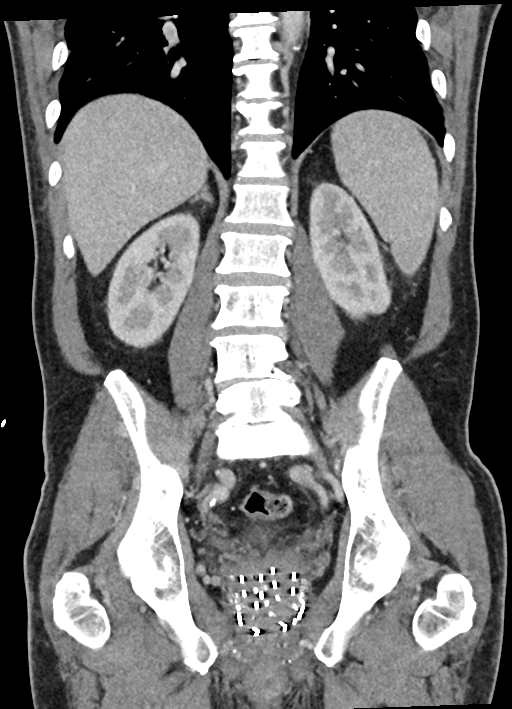
[im 42/94  soft-tissue]
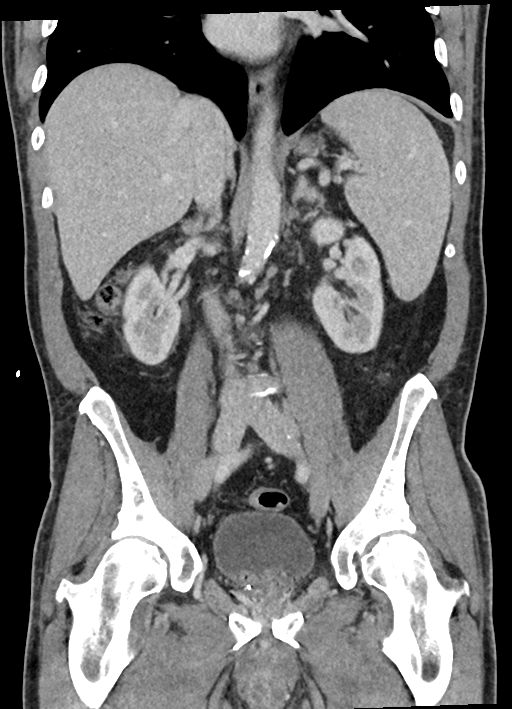
[im 52/94  soft-tissue]
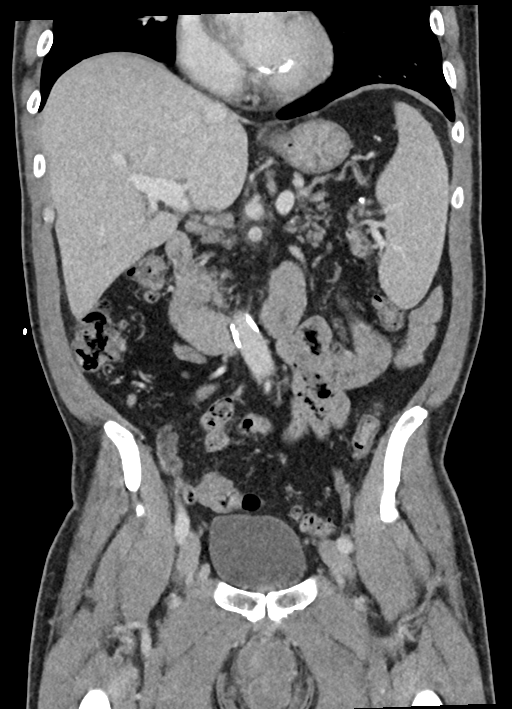

[15 of 46 positions shown; findings below may reference images not displayed]

FINDINGS: Lower chest: Bibasilar linear atelectasis. The visualized lung bases
are otherwise clear. There is calcification of the mitral annulus.

No intra-abdominal free air or free fluid.

Hepatobiliary: No focal liver abnormality is seen. No gallstones,
gallbladder wall thickening, or biliary dilatation.

Pancreas: Unremarkable. No pancreatic ductal dilatation or
surrounding inflammatory changes.

Spleen: Mild splenomegaly measuring 14 cm in length.

Adrenals/Urinary Tract: The adrenal glands are unremarkable. There
is no hydronephrosis on either side. There is symmetric enhancement
and excretion of contrast by both kidneys. The visualized ureters
and urinary bladder appear unremarkable.

Stomach/Bowel: There is severe sigmoid diverticulosis and scattered
colonic diverticula without active inflammatory changes. There is no
bowel obstruction or active inflammation. The appendix is normal.

Vascular/Lymphatic: Mild aortoiliac atherosclerotic disease. The IVC
is unremarkable. No portal venous gas. There is no adenopathy.

Reproductive: Enlarged prostate gland. Prostate brachytherapy seeds
noted. The seminal vesicles are symmetric. There is a 3.7 x 1.3 cm
high attenuating area posterior to the prostate and anterior to the
rectum (81/2) which is of indeterminate etiology but may be related
to seed implant.

Other: Small fat containing umbilical hernia.

Musculoskeletal: Mild degenerative changes of the spine. No acute
osseous pathology.
IMPRESSION: 1. No acute intra-abdominal or pelvic pathology.
2. Severe sigmoid diverticulosis. No bowel obstruction. Normal
appendix.
3. Enlarged prostate gland with brachytherapy seeds.
4. High attenuating area posterior to the prostate and anterior to
the rectum of indeterminate etiology, likely related to seed
implant.
5. Mild splenomegaly.
6. Aortic Atherosclerosis ([id number]-TL6.6).

## 2022-03-23 ENCOUNTER — Other Ambulatory Visit (HOSPITAL_COMMUNITY): Payer: Self-pay | Admitting: Urology

## 2022-03-23 DIAGNOSIS — R9721 Rising PSA following treatment for malignant neoplasm of prostate: Secondary | ICD-10-CM

## 2022-03-29 ENCOUNTER — Ambulatory Visit (HOSPITAL_COMMUNITY)
Admission: RE | Admit: 2022-03-29 | Discharge: 2022-03-29 | Disposition: A | Discharge status: Home / Self Care | Payer: Medicare Other | Source: Ambulatory Visit | Attending: Urology | Admitting: Urology

## 2022-03-29 DIAGNOSIS — R9721 Rising PSA following treatment for malignant neoplasm of prostate: Secondary | ICD-10-CM | POA: Insufficient documentation

## 2022-03-29 MED ORDER — PIFLIFOLASTAT F 18 (PYLARIFY) INJECTION
9.0000 | Freq: Once | INTRAVENOUS | Status: AC
  Administered 2022-03-29: 8.92 via INTRAVENOUS

## 2022-06-20 ENCOUNTER — Other Ambulatory Visit: Payer: Self-pay | Admitting: Internal Medicine

## 2022-06-20 DIAGNOSIS — I2584 Coronary atherosclerosis due to calcified coronary lesion: Secondary | ICD-10-CM

## 2022-06-28 ENCOUNTER — Ambulatory Visit
Admission: RE | Admit: 2022-06-28 | Discharge: 2022-06-28 | Disposition: A | Discharge status: Home / Self Care | Payer: No Typology Code available for payment source | Source: Ambulatory Visit | Attending: Internal Medicine | Admitting: Internal Medicine

## 2022-06-28 DIAGNOSIS — I2584 Coronary atherosclerosis due to calcified coronary lesion: Secondary | ICD-10-CM

## 2024-03-07 ENCOUNTER — Other Ambulatory Visit: Payer: Self-pay

## 2024-03-07 ENCOUNTER — Emergency Department (HOSPITAL_BASED_OUTPATIENT_CLINIC_OR_DEPARTMENT_OTHER)
Admission: EM | Admit: 2024-03-07 | Discharge: 2024-03-07 | Disposition: A | Discharge status: Home / Self Care | Attending: Emergency Medicine | Admitting: Emergency Medicine

## 2024-03-07 ENCOUNTER — Emergency Department (HOSPITAL_BASED_OUTPATIENT_CLINIC_OR_DEPARTMENT_OTHER)

## 2024-03-07 DIAGNOSIS — Z8546 Personal history of malignant neoplasm of prostate: Secondary | ICD-10-CM | POA: Diagnosis not present

## 2024-03-07 DIAGNOSIS — M5431 Sciatica, right side: Secondary | ICD-10-CM | POA: Insufficient documentation

## 2024-03-07 MED ORDER — KETOROLAC TROMETHAMINE 60 MG/2ML IM SOLN
30.0000 mg | Freq: Once | INTRAMUSCULAR | Status: AC
  Administered 2024-03-07: 30 mg via INTRAMUSCULAR
  Filled 2024-03-07: qty 2

## 2024-03-07 MED ORDER — OXYCODONE-ACETAMINOPHEN 5-325 MG PO TABS
1.0000 | ORAL_TABLET | Freq: Once | ORAL | Status: AC
  Administered 2024-03-07: 1 via ORAL
  Filled 2024-03-07: qty 1

## 2024-03-07 MED ORDER — OXYCODONE HCL 5 MG PO TABS: 5.0000 mg | ORAL_TABLET | Freq: Four times a day (QID) | ORAL | 0 refills | Status: DC | PRN

## 2024-03-07 MED ORDER — GABAPENTIN 300 MG PO CAPS: 300.0000 mg | ORAL_CAPSULE | Freq: Three times a day (TID) | ORAL | 0 refills | Status: AC | PRN

## 2024-03-07 MED ORDER — METHYLPREDNISOLONE 4 MG PO TBPK
ORAL_TABLET | ORAL | 0 refills | Status: DC
Start: 2024-03-07 — End: 2024-04-10

## 2024-03-07 NOTE — Discharge Instructions (Addendum)
 Please follow-up with the orthopedic doctor for your lower back pain issue.  You can take Tylenol  and ibuprofen regularly, 600 mg of ibuprofen and 650 mg of Tylenol , together as needed for pain.  You can take these medications every 6-8 hours, with food.  For more severe pain you take oxycodone  or gabapentin.  Both of these medicines can be sedating.  Do not drink alcohol or drive after taking these medications.  Do not take these medications together.  You should separate them by at least 6 hours.  Oxycodone  is an opioid narcotic.  Long-term use has been associated with addiction and dependency.  Oxycodone  can also cause constipation, and so you can use an over-the-counter stool softener while taking this medicine.

## 2024-03-07 NOTE — ED Notes (Signed)
 Dc instructions reviewed with patient. Patient voiced understanding. Dc with belongings.

## 2024-03-07 NOTE — ED Triage Notes (Signed)
 Pt c/o pain in R leg. Pt reports Monday afternoon he worked out and felt fine after but woke up Tuesday morning with sharp pain radiating from R buttocks down R leg. Denies any recent back injury or PMH with spine.

## 2024-03-07 NOTE — ED Provider Notes (Signed)
 [place] EMERGENCY DEPARTMENT AT [facility] Provider Note   CSN: [id number] Arrival date & time: 03/07/24  0710     History  Chief Complaint  Patient presents with   Leg Pain    Casey Wilcox is a 68 y.o. male presenting emerged apartment with complaint of pain in his right leg.  Patient reports that he had a particularly exertional workout at the gym on Monday, 3 days ago.  Later that evening began having pain in his right upper leg that travels down to about his knee.  Is worse with walking and bearing weight.  He says progressively has gotten worse over the past few days and is not able to sleep or bear it any longer.  He has taken ibuprofen at home for the pain.  He reports he has had low back "issues" for about a year, potentially sciatica but never like this.  He also reports he has a history of prostate cancer that is being monitored but not in active treatment, though he has had treatment in the past.  HPI     Home Medications Prior to Admission medications   Medication Sig Start Date End Date Taking? Authorizing Provider  atorvastatin  (LIPITOR) 80 MG tablet Take 80 mg by mouth daily. 02/08/24  Yes [provider]  finasteride (PROSCAR) 5 MG tablet Take 5 mg by mouth daily. 03/02/24  Yes [provider]  gabapentin (NEURONTIN) 300 MG capsule Take 1 capsule (300 mg total) by mouth 3 (three) times daily as needed for up to 21 doses. 03/07/24  Yes Arvilla Birmingham, MD  methylPREDNISolone (MEDROL DOSEPAK) 4 MG TBPK tablet Use as directed on package 03/07/24  Yes Diamantina Edinger, Janalyn Me, MD  oxyCODONE  (ROXICODONE ) 5 MG immediate release tablet Take 1 tablet (5 mg total) by mouth every 6 (six) hours as needed for up to 12 doses for severe pain (pain score 7-10). 03/07/24  Yes Asmi Fugere, Janalyn Me, MD  tamsulosin  (FLOMAX ) 0.4 MG CAPS capsule Take 0.8 mg by mouth. 06/29/21  Yes [provider]  acetaminophen  (TYLENOL ) 500 MG tablet Take 500 mg by mouth every 6  (six) hours as needed.    [provider]  atorvastatin  (LIPITOR) 40 MG tablet Take 40 mg by mouth daily. 12/09/20   [provider]  bisacodyl  (DULCOLAX) 5 MG EC tablet Take 5 mg by mouth daily as needed for moderate constipation.    [provider]  HYDROcodone -acetaminophen  (NORCO/VICODIN) 5-325 MG tablet Take 1 tablet by mouth every 6 (six) hours as needed for moderate pain. 05/24/21   Zackowski, Scott, MD  meloxicam (MOBIC) 15 MG tablet Take 15 mg by mouth daily.    [provider]  metFORMIN (GLUCOPHAGE-XR) 500 MG 24 hr tablet Take 500 mg by mouth daily. With evening meal    [provider]  ondansetron  (ZOFRAN  ODT) 4 MG disintegrating tablet Take 1 tablet (4 mg total) by mouth every 8 (eight) hours as needed for nausea or vomiting. 05/24/21   Zackowski, Scott, MD      Allergies    Patient has no known allergies.    Review of Systems   Review of Systems  Physical Exam Updated Vital Signs BP (!) 160/90   Pulse 90   Temp 98.1 F (36.7 C) (Oral)   Resp 16   Ht 5\' 6"  (1.676 m)   Wt 66.2 kg   SpO2 100%   BMI 23.57 kg/m  Physical Exam Constitutional:      General: He is not  in acute distress. HENT:     Head: Normocephalic and atraumatic.  Eyes:     Conjunctiva/sclera: Conjunctivae normal.     Pupils: Pupils are equal, round, and reactive to light.  Cardiovascular:     Rate and Rhythm: Normal rate and regular rhythm.  Pulmonary:     Effort: Pulmonary effort is normal. No respiratory distress.  Abdominal:     General: There is no distension.     Tenderness: There is no abdominal tenderness.  Musculoskeletal:     Comments: Like pain is worsened with axial loading of the spine and with standing No visible swelling of the leg or erythema or cordlike tendon palpable on exam; leg compartments are soft  Skin:    General: Skin is warm and dry.  Neurological:     General: No focal deficit present.     Mental Status: He is alert. Mental  status is at baseline.  Psychiatric:        Mood and Affect: Mood normal.        Behavior: Behavior normal.     ED Results / Procedures / Treatments   Labs (all labs ordered are listed, but only abnormal results are displayed) Labs Reviewed - No data to display  EKG None  Radiology CT Lumbar Spine Wo Contrast Result Date: 03/07/2024 CLINICAL DATA:  Low back pain, increased fracture risk hx of prostate malignancy with worsening lumbar pain, new radiculopathy-eval for pathological fx or evidence of bony met EXAM: CT LUMBAR SPINE WITHOUT CONTRAST TECHNIQUE: Multidetector CT imaging of the lumbar spine was performed without intravenous contrast administration. Multiplanar CT image reconstructions were also generated. RADIATION DOSE REDUCTION: This exam was performed according to the departmental dose-optimization program which includes automated exposure control, adjustment of the mA and/or kV according to patient size and/or use of iterative reconstruction technique. COMPARISON:  Pylarify  PET-CT 03/29/2022. Abdominopelvic CT 05/26/2021. FINDINGS: Segmentation: There are 5 lumbar type vertebral bodies. Alignment: Mild straightening of the usual lumbar lordosis with a degenerative grade 1 anterolisthesis at L3-4 and a mild convex right scoliosis. Vertebrae: No evidence of acute fracture, traumatic subluxation or osseous metastatic disease. Stable small sclerotic lesion in the left iliac bone adjacent to the sacroiliac joint, consistent with a bone island. No aggressive osseous lesions are identified. Paraspinal and other soft tissues: No acute paraspinal findings. Aortoiliac atherosclerosis. Prostate brachytherapy seeds noted. Disc levels: No significant disc space findings are demonstrated from T11-12 through L2-3. L3-4: Mild loss of disc height with progressive annular disc bulging. There is moderate facet and ligamentous hypertrophy accounting for the grade 1 anterolisthesis. These factors contribute  to mild to moderate multifactorial spinal stenosis with asymmetric lateral recess and foraminal narrowing on the right. L4-5: Progressive loss of disc height with annular disc bulging and endplate osteophytes. Mild to moderate facet and ligamentous hypertrophy. No significant central spinal stenosis noted. There is chronic left foraminal narrowing which appears similar to previous CT. L5-S1: Mild chronic loss of disc height with annular disc bulging and endplate osteophytes asymmetric to the right. Mild bilateral facet hypertrophy. No significant central spinal stenosis. Chronic moderate right foraminal narrowing appears similar to previous CT. IMPRESSION: 1. No evidence of acute lumbar spine fracture, traumatic subluxation or osseous metastatic disease. 2. Progressive lumbar spondylosis at L3-4 and L4-5 with mild to moderate multifactorial spinal stenosis at L3-4 and asymmetric lateral recess and foraminal narrowing on the right. 3. Chronic left foraminal narrowing at L4-5 and right foraminal narrowing at L5-S1. 4.  Aortic Atherosclerosis (ICD10-I70.0). Electronically Signed  By: Elmon Hagedorn M.D.   On: 03/07/2024 08:59   US  Venous Img Lower Unilateral Right Result Date: 03/07/2024 CLINICAL DATA:  RIGHT proximal leg pain for 2 days EXAM: RIGHT LOWER EXTREMITY VENOUS DOPPLER ULTRASOUND TECHNIQUE: Gray-scale sonography with compression, as well as color and duplex ultrasound, were performed to evaluate the deep venous system(s) from the level of the common femoral vein through the popliteal and proximal calf veins. COMPARISON:  None available FINDINGS: VENOUS Normal compressibility of the common femoral, superficial femoral, and popliteal veins, as well as the visualized calf veins. Visualized portions of profunda femoral vein and great saphenous vein unremarkable. No filling defects to suggest DVT on grayscale or color Doppler imaging. Doppler waveforms show normal direction of venous flow, normal respiratory  plasticity and response to augmentation. Limited views of the contralateral common femoral vein are unremarkable. OTHER None. Limitations: none IMPRESSION: No right lower extremity DVT. Electronically Signed   By: Elester Grim M.D.   On: 03/07/2024 08:51    Procedures Procedures    Medications Ordered in ED Medications  oxyCODONE -acetaminophen  (PERCOCET/ROXICET) 5-325 MG per tablet 1 tablet (1 tablet Oral Given 03/07/24 0753)  ketorolac  (TORADOL ) injection 30 mg (30 mg Intramuscular Given 03/07/24 0755)    ED Course/ Medical Decision Making/ A&P Clinical Course as of 03/07/24 1340  Thu Mar 07, 2024  0936 Patient reassessed and is feeling better after his medications.  We discussed his workup, no emergent findings, but he has degenerative disc disease and lower spinal disease that may be contributing to his sciatica radiculopathy.  He has an orthopedic doctor that he sees and is comfortable following up as an outpatient.  We will do a steroid Dosepak, a short course of oxycodone  and gabapentin (the patient and his wife are advised not to mix these 2 medications), and he can continue NSAIDs at home.  He is stable for discharge [MT]    Clinical Course User Index [MT] Remmie Bembenek, Janalyn Me, MD                                 Medical Decision Making Amount and/or Complexity of Data Reviewed Radiology: ordered.  Risk Prescription drug management.   Patient is here with right leg pain, no acute preceding trauma.  Low suspicion for pelvic fracture or leg fracture.  I suspect this could be sciatica but I think it is reasonable to evaluate for DVT, as well as CT imaging of the L-spine to evaluate for potential pathological fracture or bony mets given his history of prostate cancer and his complaint of nearly a year of low back pain.  I personally reviewed & interpreted the patient's imaging, notable for no emergent findings.  Degenerative disc disease and facet disease noted on CT imaging.  Oral  pain medicine and IM pain medicine was given for the patient's pain in the ED.        Final Clinical Impression(s) / ED Diagnoses Final diagnoses:  Sciatica of right side    Rx / DC Orders ED Discharge Orders          Ordered    oxyCODONE  (ROXICODONE ) 5 MG immediate release tablet  Every 6 hours PRN        03/07/24 0939    gabapentin (NEURONTIN) 300 MG capsule  3 times daily PRN        03/07/24 0939    methylPREDNISolone (MEDROL DOSEPAK) 4 MG TBPK tablet  03/07/24 4403              Arvilla Birmingham, MD 03/07/24 1340

## 2024-04-03 ENCOUNTER — Other Ambulatory Visit: Payer: Self-pay | Admitting: Orthopedic Surgery

## 2024-04-08 NOTE — Pre-Procedure Instructions (Signed)
 Surgical Instructions   Your procedure is scheduled on April 10, 2024. Report to [facility] Main Entrance A at 6:30 A.M., then check in with the Admitting office. Any questions or running late day of surgery: call [phone]  Questions prior to your surgery date: call [phone], Monday-Friday, 8am-4pm. If you experience any cold or flu symptoms such as cough, fever, chills, shortness of breath, etc. between now and your scheduled surgery, please notify us  at the above number.     Remember:  Do not eat after midnight the night before your surgery  You may drink clear liquids until 5:30 am the morning of your surgery.   Clear liquids allowed are: Water , Non-Citrus Juices (without pulp), Carbonated Beverages, Clear Tea (no milk, honey, etc.), Black Coffee Only (NO MILK, CREAM OR POWDERED CREAMER of any kind), and Gatorade.  Patient Instructions  The night before surgery:  No food after midnight. ONLY clear liquids after midnight  The day of surgery (if you have diabetes): Drink ONE (1) 12 oz G2 given to you in your pre admission testing appointment by 5:30am the morning of surgery. Drink in one sitting. Do not sip.  This drink was given to you during your hospital  pre-op appointment visit.  Nothing else to drink after completing the  12 oz bottle of G2.         If you have questions, please contact your surgeon's office.     Take these medicines the morning of surgery with A SIP OF WATER  : Atorvastatin  (Lipitor) Gabapentin  (Neurontin ) Tamsulosin  (Flomax )  May take these medicines IF NEEDED: Acetaminophen  (Tylenol ) Hydrocodone -Acetaminophen  (Norco)  One week prior to surgery, STOP taking any Aspirin (unless otherwise instructed by your surgeon) Aleve, Naproxen, Ibuprofen, Motrin, Advil, Goody's, BC's, all herbal medications, fish oil, and non-prescription vitamins.  WHAT DO I DO ABOUT MY DIABETES MEDICATION?   Do not take oral diabetes medicines (pills) the morning of  surgery. DO NOT TAKE METFORMIN  (GLUCOPHAGE -XR) THE MORNING OF SURGERY.   HOW TO MANAGE YOUR DIABETES BEFORE AND AFTER SURGERY  Why is it important to control my blood sugar before and after surgery? Improving blood sugar levels before and after surgery helps healing and can limit problems. A way of improving blood sugar control is eating a healthy diet by:  Eating less sugar and carbohydrates  Increasing activity/exercise  Talking with your doctor about reaching your blood sugar goals High blood sugars (greater than 180 mg/dL) can raise your risk of infections and slow your recovery, so you will need to focus on controlling your diabetes during the weeks before surgery. Make sure that the doctor who takes care of your diabetes knows about your planned surgery including the date and location.  How do I manage my blood sugar before surgery? Check your blood sugar at least 4 times a day, starting 2 days before surgery, to make sure that the level is not too high or low.  Check your blood sugar the morning of your surgery when you wake up and every 2 hours until you get to the Short Stay unit.  If your blood sugar is less than 70 mg/dL, you will need to treat for low blood sugar: Do not take insulin . Treat a low blood sugar (less than 70 mg/dL) with  cup of clear juice (cranberry or apple), 4 glucose tablets, OR glucose gel. Recheck blood sugar in 15 minutes after treatment (to make sure it is greater than 70 mg/dL). If your blood sugar is not greater than  70 mg/dL on recheck, call [phone] for further instructions. Report your blood sugar to the short stay nurse when you get to Short Stay.  If you are admitted to the hospital after surgery: Your blood sugar will be checked by the staff and you will probably be given insulin  after surgery (instead of oral diabetes medicines) to make sure you have good blood sugar levels. The goal for blood sugar control after surgery is 80-180 mg/dL.                       Do NOT Smoke (Tobacco/Vaping) for 24 hours prior to your procedure.  If you use a CPAP at night, you may bring your mask/headgear for your overnight stay.   You will be asked to remove any contacts, glasses, piercing's, hearing aid's, dentures/partials prior to surgery. Please bring cases for these items if needed.    Patients discharged the day of surgery will not be allowed to drive home, and someone needs to stay with them for 24 hours.  SURGICAL WAITING ROOM VISITATION Patients may have no more than 2 support people in the waiting area - these visitors may rotate.   Pre-op nurse will coordinate an appropriate time for 1 ADULT support person, who may not rotate, to accompany patient in pre-op.  Children under the age of 76 must have an adult with them who is not the patient and must remain in the main waiting area with an adult.  If the patient needs to stay at the hospital during part of their recovery, the visitor guidelines for inpatient rooms apply.  Please refer to the [facility] website for the visitor guidelines for any additional information.   If you received a COVID test during your pre-op visit  it is requested that you wear a mask when out in public, stay away from anyone that may not be feeling well and notify your surgeon if you develop symptoms. If you have been in contact with anyone that has tested positive in the last 10 days please notify you surgeon.      Pre-operative 5 CHG Bathing Instructions   You can play a key role in reducing the risk of infection after surgery. Your skin needs to be as free of germs as possible. You can reduce the number of germs on your skin by washing with CHG (chlorhexidine  gluconate) soap before surgery. CHG is an antiseptic soap that kills germs and continues to kill germs even after washing.   DO NOT use if you have an allergy to chlorhexidine /CHG or antibacterial soaps. If your skin becomes reddened or irritated,  stop using the CHG and notify one of our RNs at [phone].   Please shower with the CHG soap starting 4 days before surgery using the following schedule:     Please keep in mind the following:  DO NOT shave, including legs and underarms, starting the day of your first shower.   You may shave your face at any point before/day of surgery.  Place clean sheets on your bed the day you start using CHG soap. Use a clean washcloth (not used since being washed) for each shower. DO NOT sleep with pets once you start using the CHG.   CHG Shower Instructions:  Wash your face and private area with normal soap. If you choose to wash your hair, wash first with your normal shampoo.  After you use shampoo/soap, rinse your hair and body thoroughly to remove shampoo/soap residue.  Turn  the water  OFF and apply about 3 tablespoons (45 ml) of CHG soap to a CLEAN washcloth.  Apply CHG soap ONLY FROM YOUR NECK DOWN TO YOUR TOES (washing for 3-5 minutes)  DO NOT use CHG soap on face, private areas, open wounds, or sores.  Pay special attention to the area where your surgery is being performed.  If you are having back surgery, having someone wash your back for you may be helpful. Wait 2 minutes after CHG soap is applied, then you may rinse off the CHG soap.  Pat dry with a clean towel  Put on clean clothes/pajamas   If you choose to wear lotion, please use ONLY the CHG-compatible lotions that are listed below.  Additional instructions for the day of surgery: DO NOT APPLY any lotions, deodorants, cologne, or perfumes.   Do not bring valuables to the hospital. [facility] is not responsible for any belongings/valuables. Do not wear nail polish, gel polish, artificial nails, or any other type of covering on natural nails (fingers and toes) Do not wear jewelry or makeup Put on clean/comfortable clothes.  Please brush your teeth.  Ask your nurse before applying any prescription medications to the  skin.     CHG Compatible Lotions   Aveeno Moisturizing lotion  Cetaphil Moisturizing Cream  Cetaphil Moisturizing Lotion  Clairol Herbal Essence Moisturizing Lotion, Dry Skin  Clairol Herbal Essence Moisturizing Lotion, Extra Dry Skin  Clairol Herbal Essence Moisturizing Lotion, Normal Skin  Curel Age Defying Therapeutic Moisturizing Lotion with Alpha Hydroxy  Curel Extreme Care Body Lotion  Curel Soothing Hands Moisturizing Hand Lotion  Curel Therapeutic Moisturizing Cream, Fragrance-Free  Curel Therapeutic Moisturizing Lotion, Fragrance-Free  Curel Therapeutic Moisturizing Lotion, Original Formula  Eucerin Daily Replenishing Lotion  Eucerin Dry Skin Therapy Plus Alpha Hydroxy Crme  Eucerin Dry Skin Therapy Plus Alpha Hydroxy Lotion  Eucerin Original Crme  Eucerin Original Lotion  Eucerin Plus Crme Eucerin Plus Lotion  Eucerin TriLipid Replenishing Lotion  Keri Anti-Bacterial Hand Lotion  Keri Deep Conditioning Original Lotion Dry Skin Formula Softly Scented  Keri Deep Conditioning Original Lotion, Fragrance Free Sensitive Skin Formula  Keri Lotion Fast Absorbing Fragrance Free Sensitive Skin Formula  Keri Lotion Fast Absorbing Softly Scented Dry Skin Formula  Keri Original Lotion  Keri Skin Renewal Lotion Keri Silky Smooth Lotion  Keri Silky Smooth Sensitive Skin Lotion  Nivea Body Creamy Conditioning Oil  Nivea Body Extra Enriched Lotion  Nivea Body Original Lotion  Nivea Body Sheer Moisturizing Lotion Nivea Crme  Nivea Skin Firming Lotion  NutraDerm 30 Skin Lotion  NutraDerm Skin Lotion  NutraDerm Therapeutic Skin Cream  NutraDerm Therapeutic Skin Lotion  ProShield Protective Hand Cream  Provon moisturizing lotion  Please read over the following fact sheets that you were given.

## 2024-04-09 ENCOUNTER — Other Ambulatory Visit: Payer: Self-pay

## 2024-04-09 ENCOUNTER — Encounter (HOSPITAL_COMMUNITY)
Admission: RE | Admit: 2024-04-09 | Discharge: 2024-04-09 | Disposition: A | Discharge status: Home / Self Care | Source: Ambulatory Visit | Attending: Orthopedic Surgery | Admitting: Orthopedic Surgery

## 2024-04-09 ENCOUNTER — Encounter (HOSPITAL_COMMUNITY): Payer: Self-pay

## 2024-04-09 VITALS — BP 155/94 | HR 94 | Temp 98.0°F | Resp 18 | Ht 67.0 in | Wt 137.9 lb

## 2024-04-09 DIAGNOSIS — Z01818 Encounter for other preprocedural examination: Secondary | ICD-10-CM | POA: Diagnosis present

## 2024-04-09 DIAGNOSIS — E119 Type 2 diabetes mellitus without complications: Secondary | ICD-10-CM | POA: Insufficient documentation

## 2024-04-09 HISTORY — DX: Personal history of irradiation: Z92.3

## 2024-04-09 LAB — CBC
HCT: 45.8 % (ref 39.0–52.0)
Hemoglobin: 15.4 g/dL (ref 13.0–17.0)
MCH: 27.1 pg (ref 26.0–34.0)
MCHC: 33.6 g/dL (ref 30.0–36.0)
MCV: 80.6 fL (ref 80.0–100.0)
Platelets: 148 K/uL — ABNORMAL LOW (ref 150–400)
RBC: 5.68 MIL/uL (ref 4.22–5.81)
RDW: 13.1 % (ref 11.5–15.5)
WBC: 7.3 K/uL (ref 4.0–10.5)
nRBC: 0 % (ref 0.0–0.2)

## 2024-04-09 LAB — COMPREHENSIVE METABOLIC PANEL WITH GFR
ALT: 25 U/L (ref 0–44)
AST: 19 U/L (ref 15–41)
Albumin: 4 g/dL (ref 3.5–5.0)
Alkaline Phosphatase: 57 U/L (ref 38–126)
Anion gap: 10 (ref 5–15)
BUN: 10 mg/dL (ref 8–23)
CO2: 27 mmol/L (ref 22–32)
Calcium: 10 mg/dL (ref 8.9–10.3)
Chloride: 102 mmol/L (ref 98–111)
Creatinine, Ser: 1.05 mg/dL (ref 0.61–1.24)
GFR, Estimated: >60 mL/min (ref >60)
Glucose, Bld: 126 mg/dL — ABNORMAL HIGH (ref 70–99)
Potassium: 4 mmol/L (ref 3.5–5.1)
Sodium: 139 mmol/L (ref 135–145)
Total Bilirubin: 0.7 mg/dL (ref 0.0–1.2)
Total Protein: 6.8 g/dL (ref 6.5–8.1)

## 2024-04-09 LAB — SURGICAL PCR SCREEN
MRSA, PCR: NEGATIVE
Staphylococcus aureus: POSITIVE — AB

## 2024-04-09 LAB — TYPE AND SCREEN
ABO/RH(D): A POS
Antibody Screen: NEGATIVE

## 2024-04-09 LAB — HEMOGLOBIN A1C
Hgb A1c MFr Bld: 6.8 % — ABNORMAL HIGH (ref 4.8–5.6)
Mean Plasma Glucose: 148.46 mg/dL

## 2024-04-09 LAB — GLUCOSE, CAPILLARY: Glucose-Capillary: 140 mg/dL — ABNORMAL HIGH (ref 70–99)

## 2024-04-09 NOTE — Progress Notes (Signed)
 PCP - Dr. Dorn Sauers at [facility] Cardiologist - denies  PPM/ICD - denies   Chest x-ray - denies EKG - 04/09/24 Stress Test - denies ECHO - denies Cardiac Cath - denies  Sleep Study - denies  Patient states that he does not check blood sugar at home.  Last dose of GLP1 agonist-  n/a GLP1 instructions:  n/a  Blood Thinner Instructions: n/a Aspirin Instructions: n/a  ERAS Protcol - clears until 0530 PRE-SURGERY Ensure or G2- G2 as ordered  COVID TEST- no   Anesthesia review: yes - EKG  Patient denies shortness of breath, fever, cough and chest pain at PAT appointment   All instructions explained to the patient, with a verbal understanding of the material. Patient agrees to go over the instructions while at home for a better understanding. Patient also instructed to self quarantine after being tested for COVID-19. The opportunity to ask questions was provided.

## 2024-04-10 ENCOUNTER — Other Ambulatory Visit: Payer: Self-pay

## 2024-04-10 ENCOUNTER — Ambulatory Visit (HOSPITAL_COMMUNITY)

## 2024-04-10 ENCOUNTER — Observation Stay (HOSPITAL_COMMUNITY)
Admission: RE | Admit: 2024-04-10 | Discharge: 2024-04-11 | Disposition: A | Discharge status: Home / Self Care | Attending: Orthopedic Surgery | Admitting: Orthopedic Surgery

## 2024-04-10 ENCOUNTER — Encounter (HOSPITAL_COMMUNITY): Payer: Self-pay | Admitting: Orthopedic Surgery

## 2024-04-10 ENCOUNTER — Encounter (HOSPITAL_COMMUNITY): Payer: Self-pay | Admitting: Physician Assistant

## 2024-04-10 ENCOUNTER — Encounter (HOSPITAL_COMMUNITY): Payer: Self-pay | Admitting: Anesthesiology

## 2024-04-10 ENCOUNTER — Encounter (HOSPITAL_COMMUNITY)
Admission: RE | Disposition: A | Discharge status: Home / Self Care | Payer: Self-pay | Source: Home / Self Care | Attending: Orthopedic Surgery

## 2024-04-10 DIAGNOSIS — M4316 Spondylolisthesis, lumbar region: Secondary | ICD-10-CM

## 2024-04-10 DIAGNOSIS — M5416 Radiculopathy, lumbar region: Principal | ICD-10-CM | POA: Insufficient documentation

## 2024-04-10 DIAGNOSIS — M48061 Spinal stenosis, lumbar region without neurogenic claudication: Secondary | ICD-10-CM | POA: Insufficient documentation

## 2024-04-10 DIAGNOSIS — Z01818 Encounter for other preprocedural examination: Secondary | ICD-10-CM

## 2024-04-10 DIAGNOSIS — E119 Type 2 diabetes mellitus without complications: Secondary | ICD-10-CM | POA: Diagnosis not present

## 2024-04-10 DIAGNOSIS — Z7984 Long term (current) use of oral hypoglycemic drugs: Secondary | ICD-10-CM | POA: Diagnosis not present

## 2024-04-10 DIAGNOSIS — M79604 Pain in right leg: Secondary | ICD-10-CM | POA: Diagnosis present

## 2024-04-10 DIAGNOSIS — F1090 Alcohol use, unspecified, uncomplicated: Secondary | ICD-10-CM | POA: Insufficient documentation

## 2024-04-10 DIAGNOSIS — Z87891 Personal history of nicotine dependence: Secondary | ICD-10-CM | POA: Diagnosis not present

## 2024-04-10 HISTORY — PX: TRANSFORAMINAL LUMBAR INTERBODY FUSION (TLIF) WITH PEDICLE SCREW FIXATION 1 LEVEL: SHX6141

## 2024-04-10 LAB — ABO/RH: ABO/RH(D): A POS

## 2024-04-10 LAB — GLUCOSE, CAPILLARY
Glucose-Capillary: 129 mg/dL — ABNORMAL HIGH (ref 70–99)
Glucose-Capillary: 175 mg/dL — ABNORMAL HIGH (ref 70–99)
Glucose-Capillary: 248 mg/dL — ABNORMAL HIGH (ref 70–99)
Glucose-Capillary: 209 mg/dL — ABNORMAL HIGH (ref 70–99)

## 2024-04-10 SURGERY — TRANSFORAMINAL LUMBAR INTERBODY FUSION (TLIF) WITH PEDICLE SCREW FIXATION 1 LEVEL
Anesthesia: General | Site: Spine Lumbar | Laterality: Right

## 2024-04-10 MED ORDER — PROPOFOL 10 MG/ML IV BOLUS
INTRAVENOUS | Status: AC
  Filled 2024-04-10: qty 20

## 2024-04-10 MED ORDER — INSULIN ASPART 100 UNIT/ML IJ SOLN
0.0000 [IU] | Freq: Every day | INTRAMUSCULAR | Status: DC
  Administered 2024-04-10: 2 [IU] via SUBCUTANEOUS

## 2024-04-10 MED ORDER — SODIUM CHLORIDE 0.9% FLUSH: 3.0000 mL | Freq: Two times a day (BID) | INTRAVENOUS | Status: DC

## 2024-04-10 MED ORDER — MORPHINE SULFATE (PF) 2 MG/ML IV SOLN: 1.0000 mg | INTRAVENOUS | Status: DC | PRN

## 2024-04-10 MED ORDER — METHOCARBAMOL 500 MG PO TABS
500.0000 mg | ORAL_TABLET | Freq: Four times a day (QID) | ORAL | Status: DC | PRN
  Administered 2024-04-10 – 2024-04-11 (×2): 500 mg via ORAL
  Filled 2024-04-10 (×2): qty 1

## 2024-04-10 MED ORDER — SENNOSIDES-DOCUSATE SODIUM 8.6-50 MG PO TABS: 1.0000 | ORAL_TABLET | Freq: Every evening | ORAL | Status: DC | PRN

## 2024-04-10 MED ORDER — ACETAMINOPHEN 325 MG PO TABS: 650.0000 mg | ORAL_TABLET | ORAL | Status: DC | PRN

## 2024-04-10 MED ORDER — BUPIVACAINE-EPINEPHRINE (PF) 0.25% -1:200000 IJ SOLN
INTRAMUSCULAR | Status: AC
Start: 2024-04-10 — End: 2024-04-10
  Filled 2024-04-10: qty 30

## 2024-04-10 MED ORDER — 0.9 % SODIUM CHLORIDE (POUR BTL) OPTIME
TOPICAL | Status: DC | PRN
Start: 2024-04-10 — End: 2024-04-10
  Administered 2024-04-10 (×2): 1000 mL

## 2024-04-10 MED ORDER — SUGAMMADEX SODIUM 200 MG/2ML IV SOLN
INTRAVENOUS | Status: AC
Start: 2024-04-10 — End: 2024-04-10
  Filled 2024-04-10: qty 2

## 2024-04-10 MED ORDER — LACTATED RINGERS IV SOLN: INTRAVENOUS | Status: DC

## 2024-04-10 MED ORDER — DEXAMETHASONE SODIUM PHOSPHATE 10 MG/ML IJ SOLN
INTRAMUSCULAR | Status: DC | PRN
  Administered 2024-04-10: 4 mg via INTRAVENOUS

## 2024-04-10 MED ORDER — CEFAZOLIN SODIUM-DEXTROSE 2-4 GM/100ML-% IV SOLN
2.0000 g | Freq: Three times a day (TID) | INTRAVENOUS | Status: AC
  Administered 2024-04-10 (×2): 2 g via INTRAVENOUS
  Filled 2024-04-10 (×2): qty 100

## 2024-04-10 MED ORDER — ALUM & MAG HYDROXIDE-SIMETH 200-200-20 MG/5ML PO SUSP: 30.0000 mL | Freq: Four times a day (QID) | ORAL | Status: DC | PRN

## 2024-04-10 MED ORDER — OXYCODONE HCL 5 MG PO TABS: 5.0000 mg | ORAL_TABLET | Freq: Once | ORAL | Status: DC | PRN

## 2024-04-10 MED ORDER — ROCURONIUM BROMIDE 10 MG/ML (PF) SYRINGE
PREFILLED_SYRINGE | INTRAVENOUS | Status: AC
  Filled 2024-04-10: qty 10

## 2024-04-10 MED ORDER — ONDANSETRON HCL 4 MG PO TABS: 4.0000 mg | ORAL_TABLET | Freq: Four times a day (QID) | ORAL | Status: DC | PRN

## 2024-04-10 MED ORDER — SODIUM CHLORIDE (PF) 0.9 % IJ SOLN
INTRAMUSCULAR | Status: AC
  Filled 2024-04-10: qty 10

## 2024-04-10 MED ORDER — TAMSULOSIN HCL 0.4 MG PO CAPS
0.4000 mg | ORAL_CAPSULE | Freq: Every day | ORAL | Status: DC
  Administered 2024-04-10: 0.4 mg via ORAL
  Filled 2024-04-10 (×2): qty 1

## 2024-04-10 MED ORDER — ROCURONIUM BROMIDE 10 MG/ML (PF) SYRINGE
PREFILLED_SYRINGE | INTRAVENOUS | Status: DC | PRN
  Administered 2024-04-10: 50 mg via INTRAVENOUS
  Administered 2024-04-10: 20 mg via INTRAVENOUS
  Administered 2024-04-10: 30 mg via INTRAVENOUS
  Administered 2024-04-10: 20 mg via INTRAVENOUS

## 2024-04-10 MED ORDER — PHENYLEPHRINE 80 MCG/ML (10ML) SYRINGE FOR IV PUSH (FOR BLOOD PRESSURE SUPPORT)
PREFILLED_SYRINGE | INTRAVENOUS | Status: DC | PRN
  Administered 2024-04-10: 40 ug via INTRAVENOUS

## 2024-04-10 MED ORDER — MENTHOL 3 MG MT LOZG: 1.0000 | LOZENGE | OROMUCOSAL | Status: DC | PRN

## 2024-04-10 MED ORDER — CEFAZOLIN SODIUM-DEXTROSE 2-4 GM/100ML-% IV SOLN
2.0000 g | INTRAVENOUS | Status: AC
  Administered 2024-04-10: 2 g via INTRAVENOUS
  Filled 2024-04-10: qty 100

## 2024-04-10 MED ORDER — THROMBIN 20000 UNITS EX KIT
PACK | CUTANEOUS | Status: DC | PRN
  Administered 2024-04-10: 20 mL via TOPICAL

## 2024-04-10 MED ORDER — MIDAZOLAM HCL 2 MG/2ML IJ SOLN
INTRAMUSCULAR | Status: DC | PRN
  Administered 2024-04-10: 2 mg via INTRAVENOUS

## 2024-04-10 MED ORDER — OXYCODONE-ACETAMINOPHEN 5-325 MG PO TABS
1.0000 | ORAL_TABLET | ORAL | Status: DC | PRN
  Administered 2024-04-10 – 2024-04-11 (×5): 2 via ORAL
  Filled 2024-04-10 (×5): qty 2

## 2024-04-10 MED ORDER — BISACODYL 5 MG PO TBEC: 5.0000 mg | DELAYED_RELEASE_TABLET | Freq: Every day | ORAL | Status: DC | PRN

## 2024-04-10 MED ORDER — HYDROMORPHONE HCL 1 MG/ML IJ SOLN
0.2500 mg | INTRAMUSCULAR | Status: DC | PRN
  Administered 2024-04-10 (×2): 0.5 mg via INTRAVENOUS

## 2024-04-10 MED ORDER — CHLORHEXIDINE GLUCONATE 0.12 % MT SOLN
15.0000 mL | Freq: Once | OROMUCOSAL | Status: AC
  Administered 2024-04-10: 15 mL via OROMUCOSAL
  Filled 2024-04-10: qty 15

## 2024-04-10 MED ORDER — PHENYLEPHRINE 80 MCG/ML (10ML) SYRINGE FOR IV PUSH (FOR BLOOD PRESSURE SUPPORT)
PREFILLED_SYRINGE | INTRAVENOUS | Status: AC
  Filled 2024-04-10: qty 10

## 2024-04-10 MED ORDER — FENTANYL CITRATE (PF) 100 MCG/2ML IJ SOLN
INTRAMUSCULAR | Status: AC
  Filled 2024-04-10: qty 4

## 2024-04-10 MED ORDER — ACETAMINOPHEN 10 MG/ML IV SOLN: 1000.0000 mg | Freq: Once | INTRAVENOUS | Status: DC | PRN

## 2024-04-10 MED ORDER — OXYCODONE HCL 5 MG/5ML PO SOLN: 5.0000 mg | Freq: Once | ORAL | Status: DC | PRN

## 2024-04-10 MED ORDER — SODIUM CHLORIDE 0.9 % IV SOLN
0.1500 ug/kg/min | INTRAVENOUS | Status: AC
  Administered 2024-04-10: .05 ug/kg/min via INTRAVENOUS
  Filled 2024-04-10: qty 2000

## 2024-04-10 MED ORDER — DROPERIDOL 2.5 MG/ML IJ SOLN: 0.6250 mg | Freq: Once | INTRAMUSCULAR | Status: DC | PRN

## 2024-04-10 MED ORDER — LIDOCAINE 2% (20 MG/ML) 5 ML SYRINGE
INTRAMUSCULAR | Status: DC | PRN
  Administered 2024-04-10: 100 mg via INTRAVENOUS

## 2024-04-10 MED ORDER — GABAPENTIN 300 MG PO CAPS
300.0000 mg | ORAL_CAPSULE | Freq: Two times a day (BID) | ORAL | Status: DC
  Administered 2024-04-10: 300 mg via ORAL
  Filled 2024-04-10 (×2): qty 1

## 2024-04-10 MED ORDER — POVIDONE-IODINE 7.5 % EX SOLN
Freq: Once | CUTANEOUS | Status: AC
Start: 2024-04-10 — End: 2024-04-10
  Filled 2024-04-10: qty 118

## 2024-04-10 MED ORDER — ATORVASTATIN CALCIUM 80 MG PO TABS
80.0000 mg | ORAL_TABLET | Freq: Every day | ORAL | Status: DC
  Administered 2024-04-10: 80 mg via ORAL
  Filled 2024-04-10 (×2): qty 1

## 2024-04-10 MED ORDER — LIDOCAINE 2% (20 MG/ML) 5 ML SYRINGE
INTRAMUSCULAR | Status: AC
  Filled 2024-04-10: qty 5

## 2024-04-10 MED ORDER — SODIUM CHLORIDE 0.9% FLUSH: 3.0000 mL | INTRAVENOUS | Status: DC | PRN

## 2024-04-10 MED ORDER — ONDANSETRON HCL 4 MG/2ML IJ SOLN
INTRAMUSCULAR | Status: DC | PRN
  Administered 2024-04-10: 4 mg via INTRAVENOUS

## 2024-04-10 MED ORDER — ONDANSETRON HCL 4 MG/2ML IJ SOLN: 4.0000 mg | Freq: Four times a day (QID) | INTRAMUSCULAR | Status: DC | PRN

## 2024-04-10 MED ORDER — FLEET ENEMA RE ENEM: 1.0000 | ENEMA | Freq: Once | RECTAL | Status: DC | PRN

## 2024-04-10 MED ORDER — SODIUM CHLORIDE 0.9 % IV SOLN: INTRAVENOUS | Status: DC | PRN

## 2024-04-10 MED ORDER — CHLORHEXIDINE GLUCONATE CLOTH 2 % EX PADS
6.0000 | MEDICATED_PAD | Freq: Every day | CUTANEOUS | Status: DC
  Administered 2024-04-10 – 2024-04-11 (×2): 6 via TOPICAL

## 2024-04-10 MED ORDER — THROMBIN 20000 UNITS EX SOLR
CUTANEOUS | Status: AC
  Filled 2024-04-10: qty 20000

## 2024-04-10 MED ORDER — ONDANSETRON HCL 4 MG/2ML IJ SOLN
INTRAMUSCULAR | Status: AC
  Filled 2024-04-10: qty 2

## 2024-04-10 MED ORDER — MUPIROCIN 2 % EX OINT
1.0000 | TOPICAL_OINTMENT | Freq: Two times a day (BID) | CUTANEOUS | Status: DC
  Administered 2024-04-10 – 2024-04-11 (×3): 1 via NASAL
  Filled 2024-04-10 (×3): qty 22

## 2024-04-10 MED ORDER — MEPERIDINE HCL 25 MG/ML IJ SOLN: 6.2500 mg | INTRAMUSCULAR | Status: DC | PRN

## 2024-04-10 MED ORDER — DOCUSATE SODIUM 100 MG PO CAPS
100.0000 mg | ORAL_CAPSULE | Freq: Two times a day (BID) | ORAL | Status: DC
  Administered 2024-04-10 – 2024-04-11 (×3): 100 mg via ORAL
  Filled 2024-04-10 (×3): qty 1

## 2024-04-10 MED ORDER — INSULIN ASPART 100 UNIT/ML IJ SOLN
0.0000 [IU] | Freq: Three times a day (TID) | INTRAMUSCULAR | Status: DC
  Administered 2024-04-11: 2 [IU] via SUBCUTANEOUS

## 2024-04-10 MED ORDER — INSULIN ASPART 100 UNIT/ML IJ SOLN: 0.0000 [IU] | INTRAMUSCULAR | Status: DC | PRN

## 2024-04-10 MED ORDER — HYDROMORPHONE HCL 1 MG/ML IJ SOLN
INTRAMUSCULAR | Status: AC
  Filled 2024-04-10: qty 0.5

## 2024-04-10 MED ORDER — PROPOFOL 10 MG/ML IV BOLUS
INTRAVENOUS | Status: DC | PRN
  Administered 2024-04-10: 150 mg via INTRAVENOUS

## 2024-04-10 MED ORDER — SODIUM CHLORIDE 0.9 % IV SOLN
250.0000 mL | INTRAVENOUS | Status: DC
  Administered 2024-04-10: 250 mL via INTRAVENOUS

## 2024-04-10 MED ORDER — SUGAMMADEX SODIUM 200 MG/2ML IV SOLN
INTRAVENOUS | Status: DC | PRN
  Administered 2024-04-10: 200 mg via INTRAVENOUS

## 2024-04-10 MED ORDER — PHENOL 1.4 % MT LIQD
1.0000 | OROMUCOSAL | Status: DC | PRN
  Administered 2024-04-10: 1 via OROMUCOSAL
  Filled 2024-04-10: qty 177

## 2024-04-10 MED ORDER — HYDROMORPHONE HCL 1 MG/ML IJ SOLN
INTRAMUSCULAR | Status: AC
  Filled 2024-04-10: qty 1

## 2024-04-10 MED ORDER — PROPOFOL 500 MG/50ML IV EMUL
INTRAVENOUS | Status: DC | PRN
  Administered 2024-04-10: 100 ug/kg/min via INTRAVENOUS

## 2024-04-10 MED ORDER — BUPIVACAINE-EPINEPHRINE 0.25% -1:200000 IJ SOLN
INTRAMUSCULAR | Status: DC | PRN
  Administered 2024-04-10: 7 mL

## 2024-04-10 MED ORDER — BUPIVACAINE LIPOSOME 1.3 % IJ SUSP
INTRAMUSCULAR | Status: AC
  Filled 2024-04-10: qty 20

## 2024-04-10 MED ORDER — METFORMIN HCL ER 500 MG PO TB24
500.0000 mg | ORAL_TABLET | Freq: Two times a day (BID) | ORAL | Status: DC
  Administered 2024-04-10 – 2024-04-11 (×2): 500 mg via ORAL
  Filled 2024-04-10 (×2): qty 1

## 2024-04-10 MED ORDER — METHOCARBAMOL 1000 MG/10ML IJ SOLN: 500.0000 mg | Freq: Four times a day (QID) | INTRAMUSCULAR | Status: DC | PRN

## 2024-04-10 MED ORDER — ZOLPIDEM TARTRATE 5 MG PO TABS: 5.0000 mg | ORAL_TABLET | Freq: Every evening | ORAL | Status: DC | PRN

## 2024-04-10 MED ORDER — HYDROMORPHONE HCL 1 MG/ML IJ SOLN
INTRAMUSCULAR | Status: DC | PRN
  Administered 2024-04-10: .5 mg via INTRAVENOUS

## 2024-04-10 MED ORDER — FENTANYL CITRATE (PF) 100 MCG/2ML IJ SOLN
INTRAMUSCULAR | Status: DC | PRN
  Administered 2024-04-10 (×2): 50 ug via INTRAVENOUS

## 2024-04-10 MED ORDER — DEXAMETHASONE SODIUM PHOSPHATE 10 MG/ML IJ SOLN
INTRAMUSCULAR | Status: AC
  Filled 2024-04-10: qty 1

## 2024-04-10 MED ORDER — MIDAZOLAM HCL 2 MG/2ML IJ SOLN
INTRAMUSCULAR | Status: AC
  Filled 2024-04-10: qty 2

## 2024-04-10 MED ORDER — HYDROCODONE-ACETAMINOPHEN 5-325 MG PO TABS: 1.0000 | ORAL_TABLET | ORAL | Status: DC | PRN

## 2024-04-10 MED ORDER — SODIUM CHLORIDE 0.9% FLUSH
3.0000 mL | Freq: Two times a day (BID) | INTRAVENOUS | Status: DC
  Administered 2024-04-10 (×2): 3 mL via INTRAVENOUS

## 2024-04-10 MED ORDER — BUPIVACAINE-EPINEPHRINE (PF) 0.25% -1:200000 IJ SOLN
INTRAMUSCULAR | Status: DC | PRN
  Administered 2024-04-10: 40 mL

## 2024-04-10 MED ORDER — ORAL CARE MOUTH RINSE: 15.0000 mL | Freq: Once | OROMUCOSAL | Status: AC

## 2024-04-10 MED ORDER — ACETAMINOPHEN 650 MG RE SUPP: 650.0000 mg | RECTAL | Status: DC | PRN

## 2024-04-10 SURGICAL SUPPLY — 86 items
BAG COUNTER SPONGE SURGICOUNT (BAG) ×2 IMPLANT
BENZOIN TINCTURE PRP APPL 2/3 (GAUZE/BANDAGES/DRESSINGS) ×2 IMPLANT
BLADE CLIPPER SURG (BLADE) IMPLANT
BUR PRECISION FLUTE 5.0 (BURR) ×2 IMPLANT
BUR PRESCISION 1.7 ELITE (BURR) ×2 IMPLANT
BUR ROUND FLUTED 5 RND (BURR) ×2 IMPLANT
BUR ROUND PRECISION 4.0 (BURR) IMPLANT
BUR SABER RD CUTTING 3.0 (BURR) IMPLANT
CAGE SABLE 10X22 6-12 0D (Cage) IMPLANT
CANNULA GRAFT BNE VG PRE-FILL (Bone Implant) IMPLANT
CNTNR URN SCR LID CUP LEK RST (MISCELLANEOUS) ×2 IMPLANT
COVER BACK TABLE 60X90IN (DRAPES) ×2 IMPLANT
COVER MAYO STAND STRL (DRAPES) ×4 IMPLANT
COVER SURGICAL LIGHT HANDLE (MISCELLANEOUS) ×2 IMPLANT
DISPENSER GRAFT BNE VG (MISCELLANEOUS) IMPLANT
DISPENSER VIVIGEN BONE GRAFT (MISCELLANEOUS) ×1 IMPLANT
DRAPE C-ARM 42X72 X-RAY (DRAPES) ×2 IMPLANT
DRAPE C-ARMOR (DRAPES) IMPLANT
DRAPE POUCH INSTRU U-SHP 10X18 (DRAPES) ×2 IMPLANT
DRAPE SURG 17X23 STRL (DRAPES) ×8 IMPLANT
DURAPREP 26ML APPLICATOR (WOUND CARE) ×2 IMPLANT
ELECT CAUTERY BLADE 6.4 (BLADE) ×2 IMPLANT
ELECTRODE BLDE 4.0 EZ CLN MEGD (MISCELLANEOUS) ×2 IMPLANT
ELECTRODE REM PT RTRN 9FT ADLT (ELECTROSURGICAL) ×2 IMPLANT
EVACUATOR SILICONE 100CC (DRAIN) IMPLANT
FILTER STRAW FLUID ASPIR (MISCELLANEOUS) ×2 IMPLANT
GAUZE 4X4 16PLY ~~LOC~~+RFID DBL (SPONGE) ×2 IMPLANT
GAUZE SPONGE 4X4 12PLY STRL (GAUZE/BANDAGES/DRESSINGS) ×2 IMPLANT
GLOVE BIO SURGEON STRL SZ 6.5 (GLOVE) ×2 IMPLANT
GLOVE BIO SURGEON STRL SZ8 (GLOVE) ×2 IMPLANT
GLOVE BIOGEL PI IND STRL 7.0 (GLOVE) ×2 IMPLANT
GLOVE BIOGEL PI IND STRL 8 (GLOVE) ×2 IMPLANT
GLOVE SURG ENC MOIS LTX SZ6.5 (GLOVE) ×2 IMPLANT
GOWN STRL REUS W/ TWL LRG LVL3 (GOWN DISPOSABLE) ×4 IMPLANT
GOWN STRL REUS W/ TWL XL LVL3 (GOWN DISPOSABLE) ×2 IMPLANT
GRAFT BONE CANNULA VIVIGEN 3 (Bone Implant) ×1 IMPLANT
GRAFT TISS 40X70 3 THK DERM (Tissue) IMPLANT
IV CATH 14GX2 1/4 (CATHETERS) ×2 IMPLANT
KIT BASIN OR (CUSTOM PROCEDURE TRAY) ×2 IMPLANT
KIT POSITIONER JACKSON TABLE (MISCELLANEOUS) ×2 IMPLANT
KIT TURNOVER KIT B (KITS) ×2 IMPLANT
MARKER SKIN DUAL TIP RULER LAB (MISCELLANEOUS) ×4 IMPLANT
NDL 18GX1X1/2 (RX/OR ONLY) (NEEDLE) ×2 IMPLANT
NDL 22X1.5 STRL (OR ONLY) (MISCELLANEOUS) ×4 IMPLANT
NDL HYPO 25GX1X1/2 BEV (NEEDLE) ×2 IMPLANT
NDL SPNL 18GX3.5 QUINCKE PK (NEEDLE) ×4 IMPLANT
NEEDLE 18GX1X1/2 (RX/OR ONLY) (NEEDLE) ×1 IMPLANT
NEEDLE 22X1.5 STRL (OR ONLY) (MISCELLANEOUS) ×2 IMPLANT
NEEDLE HYPO 25GX1X1/2 BEV (NEEDLE) ×1 IMPLANT
NEEDLE SPNL 18GX3.5 QUINCKE PK (NEEDLE) ×2 IMPLANT
NS IRRIG 1000ML POUR BTL (IV SOLUTION) ×2 IMPLANT
PACK LAMINECTOMY ORTHO (CUSTOM PROCEDURE TRAY) ×2 IMPLANT
PACK UNIVERSAL I (CUSTOM PROCEDURE TRAY) ×2 IMPLANT
PAD ARMBOARD POSITIONER FOAM (MISCELLANEOUS) ×4 IMPLANT
PATTIES SURGICAL .5 X.5 (GAUZE/BANDAGES/DRESSINGS) ×2 IMPLANT
PATTIES SURGICAL .5 X1 (DISPOSABLE) IMPLANT
PATTIES SURGICAL .5X1.5 (GAUZE/BANDAGES/DRESSINGS) IMPLANT
PUTTY DBX 2.5CC DEPUY (Putty) IMPLANT
ROD PRE BENT EXP 40MM (Rod) IMPLANT
ROD PRE LORDOSED 5.5X45 (Rod) IMPLANT
SCREW CORTICAL VIPER 7X35 (Screw) IMPLANT
SCREW SET SINGLE INNER (Screw) IMPLANT
SCREW VIPER CORT FIX 6.00X30 (Screw) IMPLANT
SCREW VIPER CORT FIX 6X35 (Screw) IMPLANT
SPONGE INTESTINAL PEANUT (DISPOSABLE) ×2 IMPLANT
SPONGE SURGIFOAM ABS GEL 100 (HEMOSTASIS) ×2 IMPLANT
STRIP CLOSURE SKIN 1/2X4 (GAUZE/BANDAGES/DRESSINGS) ×4 IMPLANT
SURGIFLO W/THROMBIN 8M KIT (HEMOSTASIS) IMPLANT
SUT MNCRL AB 4-0 PS2 18 (SUTURE) ×2 IMPLANT
SUT VIC AB 0 CT1 18XCR BRD 8 (SUTURE) ×2 IMPLANT
SUT VIC AB 1 CT1 18XCR BRD 8 (SUTURE) ×2 IMPLANT
SUT VIC AB 2-0 CT2 18 VCP726D (SUTURE) ×2 IMPLANT
SYR 20ML LL LF (SYRINGE) ×4 IMPLANT
SYR BULB IRRIG 60ML STRL (SYRINGE) ×2 IMPLANT
SYR CONTROL 10ML LL (SYRINGE) ×4 IMPLANT
SYR TB 1ML LUER SLIP (SYRINGE) ×2 IMPLANT
TAP EXPEDIUM DL 4.35 (INSTRUMENTS) IMPLANT
TAP EXPEDIUM DL 5.0 (INSTRUMENTS) IMPLANT
TAP EXPEDIUM DL 6.0 (INSTRUMENTS) IMPLANT
TAP EXPEDIUM DL 7X2 (INSTRUMENTS) IMPLANT
TAPE CLOTH 4X10 WHT NS (GAUZE/BANDAGES/DRESSINGS) IMPLANT
TISSUE ARTHOFLEX THICK 3MM (Tissue) ×1 IMPLANT
TRAY FOLEY MTR SLVR 16FR STAT (SET/KITS/TRAYS/PACK) ×2 IMPLANT
TUBE FUNNEL GL DISP (ORTHOPEDIC DISPOSABLE SUPPLIES) IMPLANT
WATER STERILE IRR 1000ML POUR (IV SOLUTION) ×2 IMPLANT
YANKAUER SUCT BULB TIP NO VENT (SUCTIONS) ×2 IMPLANT

## 2024-04-10 NOTE — Op Note (Signed)
 PATIENT NAME: Casey Wilcox   MEDICAL RECORD NO.:   [id number]    DATE OF BIRTH: 03-Feb-1956   DATE OF PROCEDURE: 04/10/2024                               OPERATIVE REPORT     PREOPERATIVE DIAGNOSES: 1. Right-sided lumbar radiculopathy 2. L3-4 stenosis 3. L3-4 spondylolisthesis   POSTOPERATIVE DIAGNOSES: 1. Right-sided lumbar radiculopathy 2. L3-4 stenosis 3. L3-4 spondylolisthesis   PROCEDURES: 1. L3-4 decompression, including removal of right-sided L3-4 disc herniation 2. Right-sided L3-4 transforaminal lumbar interbody fusion. 3. Left-sided L3-4 posterolateral fusion. 4. Insertion of interbody device x1 (Globus expandable intervertebral spacer). 5. Placement of posterior instrumentation at L3, L4 bilaterally. 6. Use of local autograft. 7. Use of morselized allograft - ViviGen. 8. Intraoperative use of fluoroscopy.   SURGEON:  Oneil Priestly, MD.   ASSISTANTBETHA Ileana Clara, PA-C.   ANESTHESIA:  General endotracheal anesthesia.   COMPLICATIONS:  None.   DISPOSITION:  Stable.   ESTIMATED BLOOD LOSS:   Minimal   INDICATIONS FOR SURGERY:  Briefly, Mr. Reaume is a pleasant 68 y.o. -year-old patient who did present to me with severe and ongoing pain and weakness in the right Leg. I did feel that the symptoms were secondary to the findings noted above.   The patient failed conservative care and did wish to proceed with the procedure  noted above.   OPERATIVE DETAILS:  On 04/10/2024, the patient was brought to surgery and general endotracheal anesthesia was administered.  The patient was placed prone on a well-padded flat Jackson bed with a spinal frame.  Antibiotics were given and a time-out procedure was performed. The back was prepped and draped in the usual fashion.  A midline incision was made overlying the L3-4 intervertebral space.  The fascia was incised at the midline.  The paraspinal musculature was bluntly swept laterally.  Anatomic landmarks for the  pedicles were exposed. Using fluoroscopy, I did cannulate the L3 and L4 pedicles bilaterally, using a medial to lateral cortical trajectory technique.  On the left side, the posterolateral gutter and left facet joint at L3-4 was decorticated in anticipation for a left-sided posterolateral fusion, and 6 x 30 mm screws were placed and a 40-mm rod was placed and distraction was applied across the rod on the left side.  On the right side, the cannulated pedicle holes were filled with bone wax.  I then proceeded with the decompressive aspect of the procedure.  On the right side, I did perform a thorough and complete neuroforaminal decompression, with near-complete removal of the right L3-4 facet joint. I was able to expose the exiting right L4 nerve. With an assistant holding medial retraction of the traversing right L4 nerve, a disc herniation was readily apparent.  This was removed in multiple fragments, entirely decompressing the traversing right L4 nerve.  At this point, I did perform an annulotomy at the posterolateral aspect of the L3-4 intervertebral space.  I then used a series of curettes and pituitary rongeurs to perform a thorough and complete intervertebral diskectomy.  The intervertebral space was then liberally packed with autograft as well as allograft in the form of ViviGen, as was the appropriate-sized intervertebral spacer.  The spacer was then tamped into position in the usual fashion, and expanded to 11.2 mm in height.  I was very pleased with the press-fit of the spacer.  I then placed 6 x 30 mm screws on  the right at L4 and L5.  A 40-mm rod was then placed and caps were placed. The distraction was then released on the contralateral left side.  All 4 caps were then locked.  The wound was copiously irrigated with a total of approximately 3 L prior to placing the bone graft.  Additional autograft and allograft were then packed into the posterolateral gutter on the left side to  help aid in the L3-4 fusion.  The wound was explored for any undue bleeding and there was no substantial bleeding encountered.  Gel-Foam was placed over the laminectomy site.  The wound was then closed in layers using #1 Vicryl followed by 2-0 Vicryl, followed by 4-0 Monocryl.  Benzoin and Steri-Strips were applied followed by sterile dressing.   Of note, Ileana Clara was my assistant throughout surgery, and did aid in retraction, the decompression, hardware placement, suctioning, and closure.   Oneil Priestly, MD

## 2024-04-10 NOTE — Anesthesia Procedure Notes (Signed)
 Procedure Name: Intubation Date/Time: 04/10/2024 8:47 AM  Performed by: Viviana Almarie DASEN, CRNAPre-anesthesia Checklist: Patient identified, Emergency Drugs available, Suction available and Patient being monitored Patient Re-evaluated:Patient Re-evaluated prior to induction Oxygen Delivery Method: Circle system utilized Preoxygenation: Pre-oxygenation with 100% oxygen Induction Type: IV induction Ventilation: Mask ventilation without difficulty Laryngoscope Size: Mac and 3 Grade View: Grade I Tube type: Oral Tube size: 7.5 mm Number of attempts: 1 Airway Equipment and Method: Stylet and Oral airway Placement Confirmation: ETT inserted through vocal cords under direct vision, positive ETCO2 and breath sounds checked- equal and bilateral Secured at: 21 cm Tube secured with: Tape Dental Injury: Teeth and Oropharynx as per pre-operative assessment

## 2024-04-10 NOTE — Anesthesia Preprocedure Evaluation (Addendum)
 Anesthesia Evaluation  Patient identified by MRN, date of birth, ID band Patient awake    Reviewed: Allergy & Precautions, NPO status , Patient's Chart, lab work & pertinent test results  Airway Mallampati: II  TM Distance: >3 FB Neck ROM: Full    Dental  (+) Teeth Intact, Dental Advisory Given   Pulmonary former smoker   breath sounds clear to auscultation       Cardiovascular negative cardio ROS  Rhythm:Regular Rate:Normal     Neuro/Psych  Neuromuscular disease  negative psych ROS   GI/Hepatic negative GI ROS, Neg liver ROS,,,  Endo/Other  diabetes, Type 2, Oral Hypoglycemic Agents    Renal/GU negative Renal ROS     Musculoskeletal  (+) Arthritis ,    Abdominal   Peds  Hematology negative hematology ROS (+)   Anesthesia Other Findings   Reproductive/Obstetrics                              Anesthesia Physical Anesthesia Plan  ASA: 3  Anesthesia Plan: General   Post-op Pain Management: Tylenol  PO (pre-op)* and Toradol  IV (intra-op)*   Induction: Intravenous  PONV Risk Score and Plan: 3 and Ondansetron , Dexamethasone  and Midazolam   Airway Management Planned: Oral ETT  Additional Equipment: None  Intra-op Plan:   Post-operative Plan: Extubation in OR  Informed Consent: I have reviewed the patients History and Physical, chart, labs and discussed the procedure including the risks, benefits and alternatives for the proposed anesthesia with the patient or authorized representative who has indicated his/her understanding and acceptance.     Dental advisory given  Plan Discussed with: CRNA  Anesthesia Plan Comments:          Anesthesia Quick Evaluation

## 2024-04-10 NOTE — Anesthesia Postprocedure Evaluation (Signed)
 Anesthesia Post Note  Patient: Richmond Coldren  Procedure(s) Performed: RIGHT SIDED LUMBAR THREE-LUMBAR FOUR TRANSFORAMINAL LUMBAR INTERBODY FUSION (TLIF) AND DECOMPRESSION WITH INSTRUMENTATION AND ALLOGRAFT (Right: Spine Lumbar)     Patient location during evaluation: PACU Anesthesia Type: General Level of consciousness: awake and alert Pain management: pain level controlled Vital Signs Assessment: post-procedure vital signs reviewed and stable Respiratory status: spontaneous breathing, nonlabored ventilation, respiratory function stable and patient connected to nasal cannula oxygen Cardiovascular status: blood pressure returned to baseline and stable Postop Assessment: no apparent nausea or vomiting Anesthetic complications: no   No notable events documented.  Last Vitals:  Vitals:   04/10/24 1336 04/10/24 1406  BP:  (!) 148/90  Pulse: 96 96  Resp: 16 20  Temp: 36.5 C 36.5 C  SpO2: 97% 98%    Last Pain:  Vitals:   04/10/24 1532  TempSrc:   PainSc: 7                  Franky JONETTA Bald

## 2024-04-10 NOTE — H&P (Signed)
 PREOPERATIVE H&P  Chief Complaint: Right leg pain and weakness  HPI: Casey Wilcox is a 68 y.o. 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   History of radiation therapy    tumor in left ear and for prostate [facility]   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/auditory canals mri care everywhere   Prostate cancer (HCC) 04/16/2021   Umbilical hernia 04/16/2021   asymptomatic   Wears glasses 04/16/2021   Past Surgical History:  Procedure Laterality Date   COLONOSCOPY WITH PROPOFOL  N/A 12/20/2016   Procedure: COLONOSCOPY WITH PROPOFOL ;  Surgeon: Gladis MARLA Louder, MD;  Location: [facility];  Service: Endoscopy;  Laterality: N/A;   CYSTOSCOPY  04/22/2021   Procedure: CYSTOSCOPY FLEXIBLE;  Surgeon: Matilda Senior, MD;  Location: [facility];  Service: Urology;;   FOOT SURGERY Bilateral    HERNIA REPAIR     inguinal   RADIOACTIVE SEED IMPLANT N/A 04/22/2021   Procedure: RADIOACTIVE SEED IMPLANT/BRACHYTHERAPY IMPLANT;  Surgeon: Matilda Senior, MD;  Location: [facility];  Service: Urology;  Laterality: N/A;  68 seeds implanted   SPACE OAR INSTILLATION N/A 04/22/2021   Procedure: SPACE OAR INSTILLATION;  Surgeon: Matilda Senior, MD;  Location[facility];  Service: Urology;  Laterality: N/A;   Social History   Socioeconomic  History   Marital status: Single    Spouse name: Not on file   Number of children: 0   Years of education: Not on file   Highest education level: Not on file  Occupational History    Comment: real estate  Tobacco Use   Smoking status: Former    Current packs/day: 0.00    Average packs/day: 1 pack/day for 6.0 years (6.0 ttl pk-yrs)    Types: Cigarettes    Start date: 10/03/1969    Quit date: 10/04/1975    Years since quitting: 48.5   Smokeless tobacco: Never  Vaping Use   Vaping status: Never Used  Substance and Sexual Activity   Alcohol use: Yes    Comment: daily - 1-2 drinks per day   Drug use: No   Sexual activity: Yes  Other Topics Concern   Not on file  Social History Narrative   Not on file   Social Drivers of Health   Financial Resource Strain: Not on file  Food Insecurity: Not on file  Transportation Needs: Not on file  Physical Activity: Not on file  Stress: Not on file  Social Connections: Not on file   Family History  Problem Relation Age of Onset   Colon cancer Mother    Colon cancer Father    Prostate cancer Paternal Uncle    Head & neck cancer Paternal Uncle    Breast cancer Neg Hx    Pancreatic cancer Neg Hx    No Known Allergies  Prior to Admission medications   Medication Sig Start Date End Date Taking? Authorizing Provider  acetaminophen  (TYLENOL ) 500 MG tablet Take 1,000 mg by mouth every 6 (six) hours as needed for moderate pain (pain score 4-6).   Yes [provider]  atorvastatin  (LIPITOR) 80 MG tablet Take 80 mg by mouth daily. 12/09/20  Yes [provider]  docusate sodium  (COLACE) 100 MG capsule Take 100 mg by mouth daily as needed for mild constipation.   Yes [provider]  gabapentin  (NEURONTIN ) 300 MG capsule Take 1 capsule (300 mg total) by mouth 3 (three) times daily as needed for up to 21 doses. Patient taking differently: Take 300 mg by mouth 2 (two) times daily. 03/07/24  Yes Trifan, Donnice PARAS, MD   HYDROcodone -acetaminophen  (NORCO/VICODIN) 5-325 MG tablet Take 1 tablet by mouth every 6 (six) hours as needed for moderate pain. Patient taking differently: Take 1 tablet by mouth 2 (two) times daily as needed for moderate pain (pain score 4-6). 05/24/21  Yes Zackowski, Scott, MD  ibuprofen (ADVIL) 200 MG tablet Take 200-400 mg by mouth every 6 (six) hours as needed for moderate pain (pain score 4-6).   Yes [provider]  metFORMIN  (GLUCOPHAGE -XR) 500 MG 24 hr tablet Take 500 mg by mouth 2 (two) times daily with a meal.   Yes [provider]  tamsulosin  (FLOMAX ) 0.4 MG CAPS capsule Take 0.4 mg by mouth daily. 06/29/21  Yes [provider]  finasteride (PROSCAR) 5 MG tablet Take 5 mg by mouth daily. Patient not taking: Reported on 04/04/2024 03/02/24   [provider]  methylPREDNISolone  (MEDROL  DOSEPAK) 4 MG TBPK tablet Use as directed on package Patient not taking: Reported on 04/04/2024 03/07/24   Cottie Donnice PARAS, MD  ondansetron  (ZOFRAN  ODT) 4 MG disintegrating tablet Take 1 tablet (4 mg total) by mouth every 8 (eight) hours as needed for nausea or vomiting. Patient not taking: Reported on 04/04/2024 05/24/21   Zackowski, Scott, MD  oxyCODONE  (ROXICODONE ) 5 MG immediate release tablet Take 1 tablet (5 mg total) by mouth every 6 (six) hours as needed for up to 12 doses for severe pain (pain score 7-10). Patient not taking: Reported on 04/04/2024 03/07/24   Cottie Donnice PARAS, MD     All other systems have been reviewed and were otherwise negative with the exception of those mentioned in the HPI and as above.  Physical Exam: Vitals:   04/10/24 0637  BP: (!) 175/98  Pulse: 76  Resp: 17  Temp: 98.5 F (36.9 C)  SpO2: 95%    Body mass index is 20.99 kg/m.  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

## 2024-04-10 NOTE — Plan of Care (Signed)

## 2024-04-10 NOTE — Transfer of Care (Signed)
 Immediate Anesthesia Transfer of Care Note  Patient: Casey Wilcox  Procedure(s) Performed: RIGHT SIDED LUMBAR THREE-LUMBAR FOUR TRANSFORAMINAL LUMBAR INTERBODY FUSION (TLIF) AND DECOMPRESSION WITH INSTRUMENTATION AND ALLOGRAFT (Right: Spine Lumbar)  Patient Location: PACU  Anesthesia Type:General  Level of Consciousness: oriented and patient cooperative  Airway & Oxygen Therapy: Patient Spontanous Breathing  Post-op Assessment: Report given to RN, Post -op Vital signs reviewed and stable, Patient moving all extremities X 4, and Patient able to stick tongue midline  Post vital signs: Reviewed and stable  Last Vitals:  Vitals Value Taken Time  BP 119/67 04/10/24 12:11  Temp 97.7   Pulse 99 04/10/24 12:13  Resp 13 04/10/24 12:13  SpO2 94 % 04/10/24 12:13  Vitals shown include unfiled device data.  Last Pain:  Vitals:   04/10/24 0643  TempSrc:   PainSc: 5       Patients Stated Pain Goal: 1 (04/10/24 9356)  Complications: No notable events documented.

## 2024-04-11 ENCOUNTER — Encounter (HOSPITAL_COMMUNITY): Payer: Self-pay | Admitting: Orthopedic Surgery

## 2024-04-11 DIAGNOSIS — M5416 Radiculopathy, lumbar region: Secondary | ICD-10-CM | POA: Diagnosis not present

## 2024-04-11 LAB — GLUCOSE, CAPILLARY: Glucose-Capillary: 132 mg/dL — ABNORMAL HIGH (ref 70–99)

## 2024-04-11 MED ORDER — METHOCARBAMOL 500 MG PO TABS: 500.0000 mg | ORAL_TABLET | Freq: Four times a day (QID) | ORAL | 2 refills | Status: AC | PRN

## 2024-04-11 MED ORDER — OXYCODONE-ACETAMINOPHEN 5-325 MG PO TABS: 1.0000 | ORAL_TABLET | ORAL | 0 refills | Status: AC | PRN

## 2024-04-11 NOTE — Evaluation (Signed)
 Occupational Therapy Evaluation Patient Details Name: Casey Wilcox MRN: [id number] DOB: 1955/12/15 Today's Date: 04/11/2024   History of Present Illness   Pt is a 68 y/o male who presents s/p L3-L3 TLIF on 04/10/2024. PMH signfiicant for DM II, L acoustic neuroma, prostate CA s/p radiation therapy.     Clinical Impressions Casey Wilcox was evaluated s/p the above spine surgery. He is indep at baseline but reports recently unintentional weight loss and associated weakness. Upon evaluation pt was limited by surgical pain, spinal precautions, knowledge of compensatory techniques and decreased activity tolerance. Overall he needed up to superivsion A and cues for all aspects of mobility and ADLs. Provided cues and education on spinal precautions and compensatory techniques throughout, handout provided and pt demonstrated good recall during ADLs and mobility. Pt does not require further acute OT services. Recommend d/c home with support of family.       If plan is discharge home, recommend the following:   A little help with walking and/or transfers;A little help with bathing/dressing/bathroom;Assist for transportation;Assistance with cooking/housework     Functional Status Assessment   Patient has had a recent decline in their functional status and demonstrates the ability to make significant improvements in function in a reasonable and predictable amount of time.     Equipment Recommendations   Other (comment) (RW)      Precautions/Restrictions   Precautions Precautions: Fall;Back Precaution Booklet Issued: Yes (comment) Recall of Precautions/Restrictions: Intact Precaution/Restrictions Comments: Reviewed handout and pt was cued for precaution during functional mobility. Required Braces or Orthoses: Spinal Brace Spinal Brace: Thoracolumbosacral orthotic;Applied in sitting position Restrictions Weight Bearing Restrictions Per Provider Order: No     Mobility Bed  Mobility Overal bed mobility: Needs Assistance Bed Mobility: Rolling, Sidelying to Sit Rolling: Supervision Sidelying to sit: Supervision       General bed mobility comments: cue for log roll    Transfers Overall transfer level: Needs assistance Equipment used: Rolling walker (2 wheels) Transfers: Sit to/from Stand Sit to Stand: Supervision                  Balance Overall balance assessment: Needs assistance Sitting-balance support: Feet supported, No upper extremity supported Sitting balance-Leahy Scale: Fair     Standing balance support: No upper extremity supported Standing balance-Leahy Scale: Fair                             ADL either performed or assessed with clinical judgement   ADL Overall ADL's : Needs assistance/impaired Eating/Feeding: Independent   Grooming: Supervision/safety;Standing   Upper Body Bathing: Set up;Sitting   Lower Body Bathing: Set up;Sit to/from stand   Upper Body Dressing : Set up;Sitting   Lower Body Dressing: Supervision/safety;Sit to/from stand   Toilet Transfer: Supervision/safety;Ambulation;Rolling walker (2 wheels)   Toileting- Clothing Manipulation and Hygiene: Supervision/safety;Sit to/from stand;Sitting/lateral lean       Functional mobility during ADLs: Supervision/safety;Rolling walker (2 wheels) General ADL Comments: cues for spinal precuations and compensatory technique, pt did not require physical assist     Vision Baseline Vision/History: 1 Wears glasses Vision Assessment?: No apparent visual deficits Additional Comments: at baseline, dysconjugate     Perception Perception: Within Functional Limits       Praxis Praxis: WFL       Pertinent Vitals/Pain Pain Assessment Pain Assessment: Faces Faces Pain Scale: Hurts little more Pain Location: back Pain Descriptors / Indicators: Operative site guarding, Sore Pain Intervention(s): Monitored during session  Extremity/Trunk  Assessment Upper Extremity Assessment Upper Extremity Assessment: Overall WFL for tasks assessed   Lower Extremity Assessment Lower Extremity Assessment: Defer to PT evaluation   Cervical / Trunk Assessment Cervical / Trunk Assessment: Back Surgery   Communication Communication Communication: Impaired Factors Affecting Communication: Hearing impaired   Cognition Arousal: Alert Behavior During Therapy: WFL for tasks assessed/performed Cognition: No apparent impairments             OT - Cognition Comments: 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, increased time spent reviewing back precautions and brace managment                 Following commands: Intact       Cueing  General Comments   Cueing Techniques: Verbal cues;Gestural cues  VSS           Home Living Family/patient expects to be discharged to:: Private residence Living Arrangements: Alone Available Help at Discharge: Friend(s);Available 24 hours/day   Home Access: Level entry     Home Layout: One level     Bathroom Shower/Tub: Producer, television/film/video: Standard     Home Equipment: Cane - single point;Tub bench          Prior Functioning/Environment Prior Level of Function : Independent/Modified Independent                    OT Problem List: Decreased strength;Decreased range of motion;Decreased activity tolerance;Impaired balance (sitting and/or standing);Decreased safety awareness;Decreased knowledge of use of DME or AE;Decreased knowledge of precautions   OT Treatment/Interventions:        OT Goals(Current goals can be found in the care plan section)   Acute Rehab OT Goals Patient Stated Goal: home OT Goal Formulation: With patient Time For Goal Achievement: 04/11/24 Potential to Achieve Goals: Good   AM-PAC OT 6 Clicks Daily Activity     Outcome Measure Help from another person eating meals?: None Help from another person taking care of personal grooming?: A  Little Help from another person toileting, which includes using toliet, bedpan, or urinal?: A Little Help from another person bathing (including washing, rinsing, drying)?: A Little Help from another person to put on and taking off regular upper body clothing?: A Little Help from another person to put on and taking off regular lower body clothing?: A Little 6 Click Score: 19   End of Session Equipment Utilized During Treatment: Rolling walker (2 wheels);Back brace Nurse Communication: Mobility status  Activity Tolerance: Patient tolerated treatment well Patient left: in bed;with call bell/phone within reach;with family/visitor present  OT Visit Diagnosis: Unsteadiness on feet (R26.81);Other abnormalities of gait and mobility (R26.89);Repeated falls (R29.6);Feeding difficulties (R63.3);Pain                Time: 9195-9163 OT Time Calculation (min): 32 min Charges:  OT General Charges $OT Visit: 1 Visit OT Evaluation $OT Eval Moderate Complexity: 1 Mod OT Treatments $Self Care/Home Management : 8-22 mins  Lucie Kendall, OTR/L Acute Rehabilitation Services Office [phone] Secure Chat Communication Preferred   Lucie JONETTA Kendall 04/11/2024, 10:27 AM

## 2024-04-11 NOTE — Care Management Obs Status (Signed)
 MEDICARE OBSERVATION STATUS NOTIFICATION   Patient Details  Name: Casey Wilcox MRN: [id number] Date of Birth: 1956/08/12   Medicare Observation Status Notification Given:  Yes    Jon Cruel 04/11/2024, 8:27 AM

## 2024-04-11 NOTE — Progress Notes (Signed)
 Patient alert and oriented, ambulate, void, surgical site clean and dry no sign of infection. D/c instructions explain and given to the patient, all questions answered. Patient d/c home with RW per order.

## 2024-04-11 NOTE — Progress Notes (Signed)
    Patient doing well  Patient denies leg pain Patient has been ambulating   Physical Exam: Vitals:   04/10/24 2320 04/11/24 0300  BP: 103/80 130/71  Pulse: 97 (!) 110  Resp: 18 18  Temp: 98.7 F (37.1 C) 97.7 F (36.5 C)  SpO2: 97% 97%    Dressing in place NVI  POD #1 s/p L3-4 decompression and fusion, doing well  - up with PT/OT, encourage ambulation - Percocet for pain, Robaxin  for muscle spasms - d/c home today with f/u in 2 weeks

## 2024-04-11 NOTE — Evaluation (Signed)
 Physical Therapy Evaluation Patient Details Name: Casey Wilcox MRN: [id number] DOB: 25-Jan-1956 Today's Date: 04/11/2024  History of Present Illness  Pt is a 68 y/o male who presents s/p L3-L3 TLIF on 04/10/2024. PMH signfiicant for DM II, L acoustic neuroma, prostate CA s/p radiation therapy.  Clinical Impression  Pt admitted with above diagnosis. At the time of PT eval, pt was able to demonstrate transfers and ambulation with gross CGA to supervision for safety and RW for support. Pt was educated on precautions, brace application/wearing schedule, appropriate activity progression, and car transfer. Pt currently with functional limitations due to the deficits listed below (see PT Problem List). Pt will benefit from skilled PT to increase their independence and safety with mobility to allow discharge to the venue listed below.          If plan is discharge home, recommend the following: A little help with walking and/or transfers;A little help with bathing/dressing/bathroom;Assistance with cooking/housework;Assist for transportation;Help with stairs or ramp for entrance   Can travel by private vehicle        Equipment Recommendations Rolling walker (2 wheels)  Recommendations for Other Services       Functional Status Assessment Patient has had a recent decline in their functional status and demonstrates the ability to make significant improvements in function in a reasonable and predictable amount of time.     Precautions / Restrictions Precautions Precautions: Fall;Back Precaution Booklet Issued: Yes (comment) Recall of Precautions/Restrictions: Intact Precaution/Restrictions Comments: Reviewed handout and pt was cued for precaution during functional mobility. Required Braces or Orthoses: Spinal Brace Spinal Brace: Thoracolumbosacral orthotic;Applied in sitting position Restrictions Weight Bearing Restrictions Per Provider Order: No      Mobility  Bed Mobility                General bed mobility comments: Pt was received sitting up EOB    Transfers Overall transfer level: Needs assistance Equipment used: Rolling walker (2 wheels) Transfers: Sit to/from Stand Sit to Stand: Supervision           General transfer comment: VC's for hand placement on seated surface for safety.    Ambulation/Gait Ambulation/Gait assistance: Contact guard assist, Supervision Gait Distance (Feet): 300 Feet Assistive device: Rolling walker (2 wheels) Gait Pattern/deviations: Step-through pattern, Decreased stride length, Trunk flexed, Narrow base of support Gait velocity: Decreased Gait velocity interpretation: 1.31 - 2.62 ft/sec, indicative of limited community ambulator   General Gait Details: VC's for improved posture, closer walker proximity and forward gaze. No assist required and no overt LOB noted.  Stairs Stairs: Yes Stairs assistance: Contact guard assist Stair Management: One rail Right, Step to pattern, Forwards Number of Stairs: 5 General stair comments: VC's for sequencing and general safety. Practiced as pt has plans to go to a friends house next week who has 3 stairs to enter.  Wheelchair Mobility     Tilt Bed    Modified Rankin (Stroke Patients Only)       Balance Overall balance assessment: Needs assistance Sitting-balance support: Feet supported, No upper extremity supported Sitting balance-Leahy Scale: Fair     Standing balance support: No upper extremity supported Standing balance-Leahy Scale: Fair Standing balance comment: Dynamically, needs RW for support but can stand statically without UE support                             Pertinent Vitals/Pain Pain Assessment Pain Assessment: Faces Faces Pain Scale: Hurts little more Pain Location:  back Pain Descriptors / Indicators: Operative site guarding, Sore Pain Intervention(s): Limited activity within patient's tolerance, Monitored during session, Repositioned     Home Living Family/patient expects to be discharged to:: Private residence Living Arrangements: Alone Available Help at Discharge: Friend(s);Available 24 hours/day (initially, and then will be intermittent)   Home Access: Level entry       Home Layout: One level Home Equipment: None      Prior Function Prior Level of Function : Independent/Modified Independent                     Extremity/Trunk Assessment   Upper Extremity Assessment Upper Extremity Assessment: Defer to OT evaluation    Lower Extremity Assessment Lower Extremity Assessment: Generalized weakness (Consistent with pre-op diagnosis)    Cervical / Trunk Assessment Cervical / Trunk Assessment: Back Surgery  Communication   Communication Communication: Impaired Factors Affecting Communication: Hearing impaired    Cognition Arousal: Alert Behavior During Therapy: WFL for tasks assessed/performed   PT - Cognitive impairments: No apparent impairments                         Following commands: Intact       Cueing Cueing Techniques: Verbal cues, Gestural cues     General Comments      Exercises     Assessment/Plan    PT Assessment Patient needs continued PT services  PT Problem List Decreased strength;Decreased activity tolerance;Decreased balance;Decreased mobility;Decreased knowledge of use of DME;Decreased safety awareness;Decreased knowledge of precautions;Pain       PT Treatment Interventions DME instruction;Gait training;Stair training;Functional mobility training;Therapeutic activities;Therapeutic exercise;Balance training;Patient/family education    PT Goals (Current goals can be found in the Care Plan section)  Acute Rehab PT Goals Patient Stated Goal: Home at d/c, get back to a gym routine and active lifestyle PT Goal Formulation: With patient/family Time For Goal Achievement: 04/18/24 Potential to Achieve Goals: Good    Frequency Min 5X/week      Co-evaluation               AM-PAC PT 6 Clicks Mobility  Outcome Measure Help needed turning from your back to your side while in a flat bed without using bedrails?: A Little Help needed moving from lying on your back to sitting on the side of a flat bed without using bedrails?: A Little Help needed moving to and from a bed to a chair (including a wheelchair)?: A Little Help needed standing up from a chair using your arms (e.g., wheelchair or bedside chair)?: A Little Help needed to walk in hospital room?: A Little Help needed climbing 3-5 steps with a railing? : A Little 6 Click Score: 18    End of Session Equipment Utilized During Treatment: Gait belt;Back brace Activity Tolerance: Patient tolerated treatment well Patient left: in bed;with call bell/phone within reach;with family/visitor present Nurse Communication: Mobility status PT Visit Diagnosis: Unsteadiness on feet (R26.81);Pain Pain - part of body:  (back)    Time: 9154-9089 PT Time Calculation (min) (ACUTE ONLY): 25 min   Charges:   PT Evaluation $PT Eval Low Complexity: 1 Low PT Treatments $Gait Training: 8-22 mins PT General Charges $$ ACUTE PT VISIT: 1 Visit         Leita Sable, PT, DPT Acute Rehabilitation Services Secure Chat Preferred Office: [phone]   Leita JONETTA Sable 04/11/2024, 9:48 AM

## 2024-04-12 MED FILL — Thrombin For Soln 20000 Unit: CUTANEOUS | Qty: 1 | Status: AC

## 2024-04-12 NOTE — Addendum Note (Signed)
 Addendum  created 04/12/24 1328 by Viviana Almarie DASEN, CRNA   Intraprocedure Event edited

## 2024-05-01 NOTE — Discharge Summary (Signed)
 Patient ID: Casey Wilcox MRN: [id number] DOB/AGE: [birth year] 68 y.o.  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 male who was admitted 04/10/2024 for operative treatment of Radiculopathy, lumbar region. Patient has severe unremitting pain that affects sleep, daily activities, and work/hobbies. After pre-op clearance the patient was taken to the operating room on 04/10/2024 and underwent  Procedure(s): RIGHT SIDED LUMBAR THREE-LUMBAR FOUR TRANSFORAMINAL LUMBAR INTERBODY FUSION (TLIF) AND DECOMPRESSION WITH INSTRUMENTATION AND ALLOGRAFT.    Patient was given perioperative antibiotics:  Anti-infectives (From admission, onward)    Start     Dose/Rate Route Frequency Ordered Stop   04/10/24 1700  ceFAZolin  (ANCEF ) IVPB 2g/100 mL premix        2 g 200 mL/hr over 30 Minutes Intravenous Every 8 hours 04/10/24 1326 04/11/24 1007   04/10/24 0645  ceFAZolin  (ANCEF ) IVPB 2g/100 mL premix        2 g 200 mL/hr  over 30 Minutes Intravenous On call to O.R. 04/10/24 9360 04/10/24 0910        Patient was given sequential compression devices, early ambulation to prevent DVT.  Patient benefited maximally from hospital stay and there were no complications.    Recent vital signs: BP 133/86 (BP Location: Left Arm)   Pulse 93   Temp 97.7 F (36.5 C) (Oral)   Resp 18   Ht 5' 7 (1.702 m)   Wt 60.8 kg   SpO2 98%   BMI 20.99 kg/m    Discharge Medications:   Allergies as of 04/11/2024   No Known Allergies      Medication List     TAKE these medications    acetaminophen  500 MG tablet Commonly known as: TYLENOL  Take 1,000 mg by mouth every 6 (six) hours as needed for moderate pain (pain score 4-6).   atorvastatin  80 MG tablet Commonly known as: LIPITOR Take 80 mg by mouth daily.   docusate sodium  100 MG capsule Commonly known as: COLACE Take 100 mg by mouth daily as needed for mild constipation.   finasteride 5 MG tablet Commonly known as: PROSCAR Take 5 mg by mouth daily.   gabapentin  300 MG capsule Commonly known as: Neurontin  Take 1 capsule (300 mg total) by mouth 3 (three) times daily as needed for up to 21 doses. What changed: when to take this   HYDROcodone -acetaminophen  5-325 MG tablet Commonly known as: NORCO/VICODIN Take 1 tablet by mouth every 6 (six) hours as needed for moderate pain. What changed: when to take  this   metFORMIN  500 MG 24 hr tablet Commonly known as: GLUCOPHAGE -XR Take 500 mg by mouth 2 (two) times daily with a meal.   methocarbamol  500 MG tablet Commonly known as: ROBAXIN  Take 1 tablet (500 mg total) by mouth every 6 (six) hours as needed for muscle spasms.   ondansetron  4 MG disintegrating tablet Commonly known as: Zofran  ODT Take 1 tablet (4 mg total) by mouth every 8 (eight) hours as needed for nausea or vomiting.   oxyCODONE -acetaminophen  5-325 MG tablet Commonly known as: PERCOCET/ROXICET Take 1-2 tablets by mouth every 4 (four) hours as  needed for severe pain (pain score 7-10).   tamsulosin  0.4 MG Caps capsule Commonly known as: FLOMAX  Take 0.4 mg by mouth daily.        Diagnostic Studies: DG Lumbar Spine 1 View Result Date: 04/10/2024 CLINICAL DATA:  Elective surgery.  Intraop localization. EXAM: LUMBAR SPINE - 1 VIEW COMPARISON:  Preoperative imaging FINDINGS: Single lateral spot view of the lumbar spine submitted from the operating room. Surgical instruments localize posteriorly at the L3-L4 and L4-L5 levels. IMPRESSION: Intraoperative localization during lumbar spine surgery. Electronically Signed   By: Andrea Gasman M.D.   On: 04/10/2024 13:02   DG Lumbar Spine 2-3 Views Result Date: 04/10/2024 CLINICAL DATA:  Elective surgery. EXAM: LUMBAR SPINE - 2-3 VIEW COMPARISON:  Preoperative imaging FINDINGS: Two fluoroscopic spot views of the lumbar spine submitted from the operating room. Posterior rod and pedicle screw fixation L3-L4 with interbody spacer. Fluoroscopy time 35 seconds. Dose 16.46 mGy. IMPRESSION: Intraoperative fluoroscopy during L3-L4 fusion. Electronically Signed   By: Andrea Gasman M.D.   On: 04/10/2024 13:01   DG C-Arm 1-60 Min-No Report Result Date: 04/10/2024 Fluoroscopy was utilized by the requesting physician.  No radiographic interpretation.   DG C-Arm 1-60 Min-No Report Result Date: 04/10/2024 Fluoroscopy was utilized by the requesting physician.  No radiographic interpretation.   DG C-Arm 1-60 Min-No Report Result Date: 04/10/2024 Fluoroscopy was utilized by the requesting physician.  No radiographic interpretation.    Disposition: Discharge disposition: 01-Home or Self Care        POD #1 s/p L3-4 decompression and fusion, doing well   - up with PT/OT, encourage ambulation - Percocet for pain, Robaxin  for muscle spasms -Scripts for pain sent to pharmacy electronically  -D/C instructions sheet printed and in chart -D/C today  -F/U in office 2 weeks   Signed: Ruslan Mccabe J  Loron Weimer 05/01/2024, 10:50 AM

## 2024-08-02 ENCOUNTER — Ambulatory Visit

## 2024-08-02 VITALS — BP 138/82 | HR 96

## 2024-08-02 DIAGNOSIS — R42 Dizziness and giddiness: Secondary | ICD-10-CM | POA: Diagnosis present

## 2024-08-02 DIAGNOSIS — R2689 Other abnormalities of gait and mobility: Secondary | ICD-10-CM | POA: Diagnosis present

## 2024-08-02 DIAGNOSIS — R2681 Unsteadiness on feet: Secondary | ICD-10-CM | POA: Insufficient documentation

## 2024-08-02 NOTE — Therapy (Signed)
 OUTPATIENT PHYSICAL THERAPY VESTIBULAR EVALUATION     Patient Name: Casey Wilcox MRN: [id number] DOB:August 01, 1956, 68 y.o., male Today's Date: 08/02/2024  END OF SESSION:  PT End of Session - 08/02/24 0934     Visit Number 1    Number of Visits 9    Date for Recertification  08/30/24    Authorization Type Medicare + BCBS    PT Start Time 0933    PT Stop Time 1013    PT Time Calculation (min) 40 min    Equipment Utilized During Treatment Gait belt    Activity Tolerance Patient tolerated treatment well    Behavior During Therapy WFL for tasks assessed/performed          Past Medical History:  Diagnosis Date   Arthritis 04/16/2021   shoulder and hands oa   Balance problem 04/16/2021   occ due to left acoustic neuroma   COVID 11/2020   sniffles x 3 to 5 days all symptoms resolved   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   History of radiation therapy    tumor in left ear and for prostate cacner   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/auditory canals mri care everywhere   Prostate cancer (HCC) 04/16/2021   Umbilical hernia 04/16/2021   asymptomatic   Wears glasses 04/16/2021   Past Surgical History:  Procedure Laterality Date   COLONOSCOPY WITH PROPOFOL  N/A 12/20/2016   Procedure: COLONOSCOPY WITH PROPOFOL ;  Surgeon: Gladis MARLA Louder, MD;  Location: [facility];  Service: Endoscopy;  Laterality: N/A;   CYSTOSCOPY  04/22/2021   Procedure: CYSTOSCOPY FLEXIBLE;  Surgeon: Matilda Senior, MD;  Location: [facility];  Service: Urology;;   FOOT SURGERY Bilateral    HERNIA REPAIR     inguinal   RADIOACTIVE SEED IMPLANT N/A 04/22/2021   Procedure: RADIOACTIVE SEED IMPLANT/BRACHYTHERAPY IMPLANT;  Surgeon: Matilda Senior, MD;  Location: [facility];  Service: Urology;  Laterality: N/A;  69 seeds implanted   SPACE OAR INSTILLATION N/A 04/22/2021   Procedure:  SPACE OAR INSTILLATION;  Surgeon: Matilda Senior, MD;  Location: [facility];  Service: Urology;  Laterality: N/A;   TRANSFORAMINAL LUMBAR INTERBODY FUSION (TLIF) WITH PEDICLE SCREW FIXATION 1 LEVEL Right 04/10/2024   Procedure: RIGHT SIDED LUMBAR THREE-LUMBAR FOUR TRANSFORAMINAL LUMBAR INTERBODY FUSION (TLIF) AND DECOMPRESSION WITH INSTRUMENTATION AND ALLOGRAFT;  Surgeon: Beuford Anes, MD;  Location: [facility];  Service: Orthopedics;  Laterality: Right;   Patient Active Problem List   Diagnosis Date Noted   Radiculopathy, lumbar region 04/10/2024   Fever 05/25/2021   Dysuria 05/25/2021   Constipation 05/25/2021   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   History of adenomatous polyp of colon 12/29/2020   Hypercholesterolemia 12/29/2020   Localized, 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/2020   Malignant neoplasm of prostate (HCC) 12/29/2020   Sleep disturbance 12/29/2020   Squamous cell carcinoma of skin of face 12/29/2020   Type 2 diabetes mellitus without complications (HCC) 12/29/2020   Asymmetrical left sensorineural hearing loss 11/19/2019   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HCC) 11/19/2019    PCP: Dorn Sauers, MD REFERRING PROVIDER: Franky Lesser, DC  REFERRING DIAG: D33.3 (ICD-10-CM) - Benign neoplasm of cranial nerves   THERAPY DIAG:  Dizziness and giddiness - Plan: PT plan of care cert/re-cert  Unsteadiness on feet - Plan: PT plan of care cert/re-cert  Other abnormalities  of gait and mobility - Plan: PT plan of care cert/re-cert  ONSET DATE: 07/31/24 referral   Rationale for Evaluation and Treatment: Rehabilitation  SUBJECTIVE:   SUBJECTIVE STATEMENT: Patient arrives to clinic alone, no AD. He has a known acoustic neuroma in the L ear- treated with radiation. Reports hearing loss in the L ear related to tumor and XRT. Also had back sx ~4 months ago. Reports difficulty with depth perception. 2  days a week with personal trainer, going to PT for his neck.  Pt accompanied by: self  PERTINENT HISTORY: arthritis, DM 2, L acoustic neuroma, prostate CA, B cataracts, h/o Lasik sx  PAIN:  Are you having pain? Ache in the neck   PRECAUTIONS: Fall  RED FLAGS: Cervical red flags: Dysphagia No, Dysmetria No, Diplopia No, Nystagmus No, and Nausea No   WEIGHT BEARING RESTRICTIONS: No  FALLS: Has patient fallen in last 6 months? Yes. Number of falls 3-4; related to depth perception   LIVING ENVIRONMENT: Lives with: lives with an adult companion Lives in: House/apartment Stairs: No Has following equipment at home: Single point cane, Environmental Consultant - 2 wheeled, and shower chair  PLOF: Independent driving, relator, avid skier/ snowboarder  PATIENT GOALS: try to get my balance back  OBJECTIVE:  Note: Objective measures were completed at Evaluation unless otherwise noted.  DIAGNOSTIC FINDINGS: none applicable   COGNITION: Overall cognitive status: Within functional limits for tasks assessed   SENSATION: WFL  POSTURE:  forward head  Cervical ROM:   Slightly limited, more of an ache  STRENGTH: WFL  BED MOBILITY:  Independent, denies dizziness  GAIT: Gait pattern: step through pattern and wide BOS Comments: B feet IR   VESTIBULAR ASSESSMENT:  GENERAL OBSERVATION: NAD, no AD   SYMPTOM BEHAVIOR:  Subjective history: see above, vertigo symptoms progressively getting worse (even more so after back sx); describes vertigo as more of a momentary increase in his unbalance  Non-Vestibular symptoms: changes in hearing and nausea/vomiting  Type of dizziness: Imbalance (Disequilibrium)  Frequency: daily   Duration: throughout the day   Aggravating factors: Worse in the dark and enclosed spaces (on a trail)   Relieving factors: no known relieving factors  Progression of symptoms: worse  OCULOMOTOR EXAM:  Ocular Alignment: L eye resting slightly medially, possible  anisometropia?   Ocular ROM: No Limitations  Spontaneous Nystagmus: absent  Gaze-Induced Nystagmus: absent  Smooth Pursuits: intact  Saccades: extra eye movements  Convergence/Divergence: 5-6 cm   Cover-cross-cover test: Normal  VESTIBULAR - OCULAR REFLEX:   Slow VOR: Normal  VOR Cancellation: Corrective Saccades  Head-Impulse Test: HIT Right: negative HIT Left: positive   POSITIONAL TESTING: TBA PRN    Vitals:   08/02/24 0954  BP: 138/82  Pulse: 96    MCTSIB: -condition 1: 30s -condition 2: 17s -condition 3: 30s -condition 4: 5s  TREATMENT  -self care/home management: -review of pathophys of acoustic neuroma and impact on vestib system + LOB   PATIENT EDUCATION: Education details: PT POC, exam findings, see above Person educated: Patient Education method: Explanation Education comprehension: verbalized understanding and needs further education  HOME EXERCISE PROGRAM:  GOALS: Goals reviewed with patient? Yes  SHORT TERM GOALS: = LTG based on PT POC length   LONG TERM GOALS: Target date: 08/30/24   Pt will be independent with final HEP for improved balance  Baseline: to be provided Goal status: INITIAL  2.  Patient will complete condition 2 of MCTSIB for >/=30s for improved balance and decreased risk of falls Baseline: 17s Goal status: INITIAL  3.  Patient will complete condition 4 of MCTSIB for >/=30s to demonstrate improved balance and decreased risk for falls Baseline: 5s Goal status: INITIAL  4.  FGA goal Baseline: to be completed  Goal status: INITIAL   ASSESSMENT:  CLINICAL IMPRESSION: Patient is a 68 y.o. male who was seen today for physical therapy evaluation and treatment for impaired balance related to a known L acoustic neuroma. While treatment has reduced the size of the neuroma, he does still have the lasting impact  of the neuroma. His MCTSIB is reflective of poor vestibular functioning with vision and vision + somatosensation are occluded/altered. This significantly increases his risk for falls. As well, he is developing cataracts and has a known astigmatism in his L eye that is likely contributing to his impaired stereopsis. This also increases is risk for recurrent falls. He would benefit from skilled PT services to address the above mentioned deficits.   OBJECTIVE IMPAIRMENTS: Abnormal gait, difficulty walking, dizziness, and impaired vision/preception.   ACTIVITY LIMITATIONS: stairs, locomotion level, and caring for others  PARTICIPATION LIMITATIONS: interpersonal relationship, driving, shopping, community activity, occupation, and yard work  PERSONAL FACTORS: Age, Fitness, Past/current experiences, Profession, Time since onset of injury/illness/exacerbation, and 3+ comorbidities: see above are also affecting patient's functional outcome.   REHAB POTENTIAL: Fair chronicity and etiology   CLINICAL DECISION MAKING: Stable/uncomplicated  EVALUATION COMPLEXITY: Low   PLAN:  PT FREQUENCY: 2x/week  PT DURATION: 4 weeks  PLANNED INTERVENTIONS: 02835- PT Re-evaluation, 97750- Physical Performance Testing, 97110-Therapeutic exercises, 97530- Therapeutic activity, [id number]- Neuromuscular re-education, 97535- Self Care, 02859- Manual therapy, [phone]- Gait training, [phone]- Canalith repositioning, [id number]- Aquatic Therapy, [phone] (1-2 muscles), [phone] (3+ muscles)- Dry Needling, Patient/Family education, Balance training, Stair training, Vestibular training, Visual/preceptual remediation/compensation, Cognitive remediation, and DME instructions  PLAN FOR NEXT SESSION: FGA + goal, corner balance HEP, VOR, saccades, tasks to challenge/improve depth perception (possibly is due to different rx in each eye?)   Delon DELENA Pop, PT Delon DELENA Pop, PT, DPT, CBIS  08/02/2024, 11:38 AM

## 2024-08-06 ENCOUNTER — Encounter: Payer: Self-pay | Admitting: Physical Therapy

## 2024-08-06 ENCOUNTER — Ambulatory Visit: Admitting: Physical Therapy

## 2024-08-06 VITALS — BP 168/109 | HR 104

## 2024-08-06 DIAGNOSIS — R2681 Unsteadiness on feet: Secondary | ICD-10-CM | POA: Insufficient documentation

## 2024-08-06 DIAGNOSIS — R2689 Other abnormalities of gait and mobility: Secondary | ICD-10-CM

## 2024-08-06 DIAGNOSIS — R42 Dizziness and giddiness: Secondary | ICD-10-CM

## 2024-08-06 NOTE — Therapy (Signed)
 OUTPATIENT PHYSICAL THERAPY VESTIBULAR TREATMENT - ARRIVED NO CHARGE     Patient Name: Casey Wilcox MRN: [id number] DOB:August 18, 1956, 68 y.o., male Today's Date: 08/06/2024  END OF SESSION:  PT End of Session - 08/06/24 1619     Visit Number 2   arrived no charge   Number of Visits 9    Date for Recertification  08/30/24    Authorization Type Medicare + BCBS    PT Start Time 1618    PT Stop Time 1632   arrived no charge - high BP   PT Time Calculation (min) 14 min    Equipment Utilized During Treatment --    Activity Tolerance Treatment limited secondary to medical complications (Comment)    Behavior During Therapy [facility] for tasks assessed/performed          Past Medical History:  Diagnosis Date   Arthritis 04/16/2021   shoulder and hands oa   Balance problem 04/16/2021   occ due to left acoustic neuroma   COVID 11/2020   sniffles x 3 to 5 days all symptoms resolved   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   History of radiation therapy    tumor in left ear and for prostate cacner   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/auditory canals mri care everywhere   Prostate cancer (HCC) 04/16/2021   Umbilical hernia 04/16/2021   asymptomatic   Wears glasses 04/16/2021   Past Surgical History:  Procedure Laterality Date   COLONOSCOPY WITH PROPOFOL  N/A 12/20/2016   Procedure: COLONOSCOPY WITH PROPOFOL ;  Surgeon: Gladis MARLA Louder, MD;  Location: [facility];  Service: Endoscopy;  Laterality: N/A;   CYSTOSCOPY  04/22/2021   Procedure: CYSTOSCOPY FLEXIBLE;  Surgeon: Matilda Senior, MD;  Location: [facility];  Service: Urology;;   FOOT SURGERY Bilateral    HERNIA REPAIR     inguinal   RADIOACTIVE SEED IMPLANT N/A 04/22/2021   Procedure: RADIOACTIVE SEED IMPLANT/BRACHYTHERAPY IMPLANT;  Surgeon: Matilda Senior, MD;  Location: [facility];  Service: Urology;   Laterality: N/A;  69 seeds implanted   SPACE OAR INSTILLATION N/A 04/22/2021   Procedure: SPACE OAR INSTILLATION;  Surgeon: Matilda Senior, MD;  Location: [facility];  Service: Urology;  Laterality: N/A;   TRANSFORAMINAL LUMBAR INTERBODY FUSION (TLIF) WITH PEDICLE SCREW FIXATION 1 LEVEL Right 04/10/2024   Procedure: RIGHT SIDED LUMBAR THREE-LUMBAR FOUR TRANSFORAMINAL LUMBAR INTERBODY FUSION (TLIF) AND DECOMPRESSION WITH INSTRUMENTATION AND ALLOGRAFT;  Surgeon: Beuford Anes, MD;  Location: [facility];  Service: Orthopedics;  Laterality: Right;   Patient Active Problem List   Diagnosis Date Noted   Radiculopathy, lumbar region 04/10/2024   Fever 05/25/2021   Dysuria 05/25/2021   Constipation 05/25/2021   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   History of adenomatous polyp of colon 12/29/2020   Hypercholesterolemia 12/29/2020   Localized, 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/2020   Malignant neoplasm of prostate (HCC) 12/29/2020   Sleep disturbance 12/29/2020   Squamous cell carcinoma of skin of face 12/29/2020   Type 2 diabetes mellitus without complications (HCC) 12/29/2020   Asymmetrical left sensorineural hearing loss 11/19/2019   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HCC) 11/19/2019    PCP: Dorn Sauers, MD REFERRING PROVIDER: Franky Lesser, DC  REFERRING DIAG: D33.3 (ICD-10-CM) - Benign neoplasm of cranial nerves   THERAPY DIAG:  Dizziness and giddiness  Unsteadiness on feet  Other abnormalities of gait and mobility  ONSET DATE: 07/31/24 referral   Rationale for Evaluation and Treatment: Rehabilitation  SUBJECTIVE:   SUBJECTIVE STATEMENT:  Reports feeling a little more imbalanced today, feels it more at the end of the day. Doesn't think he is dizzy. Doctor said he has had more hearing loss. Has to really watch how he is walking because he has had some falls due to tripping over something. The dark is  disorienting - has night lights.   Pt accompanied by: self  PERTINENT HISTORY: arthritis, DM 2, L acoustic neuroma, prostate CA, B cataracts, h/o Lasik sx  PAIN:  Are you having pain? Ache in the neck   PRECAUTIONS: Fall  RED FLAGS: Cervical red flags: Dysphagia No, Dysmetria No, Diplopia No, Nystagmus No, and Nausea No   WEIGHT BEARING RESTRICTIONS: No  FALLS: Has patient fallen in last 6 months? Yes. Number of falls 3-4; related to depth perception   LIVING ENVIRONMENT: Lives with: lives with an adult companion Lives in: House/apartment Stairs: No Has following equipment at home: Single point cane, Environmental Consultant - 2 wheeled, and shower chair  PLOF: Independent driving, relator, avid skier/ snowboarder  PATIENT GOALS: try to get my balance back  OBJECTIVE:  Note: Objective measures were completed at Evaluation unless otherwise noted.  DIAGNOSTIC FINDINGS: none applicable   COGNITION: Overall cognitive status: Within functional limits for tasks assessed   SENSATION: WFL  POSTURE:  forward head  Cervical ROM:   Slightly limited, more of an ache  STRENGTH: WFL  BED MOBILITY:  Independent, denies dizziness  GAIT: Gait pattern: step through pattern and wide BOS Comments: B feet IR   VESTIBULAR ASSESSMENT:  GENERAL OBSERVATION: NAD, no AD   SYMPTOM BEHAVIOR:  Subjective history: see above, vertigo symptoms progressively getting worse (even more so after back sx); describes vertigo as more of a momentary increase in his unbalance  Non-Vestibular symptoms: changes in hearing and nausea/vomiting  Type of dizziness: Imbalance (Disequilibrium)  Frequency: daily   Duration: throughout the day   Aggravating factors: Worse in the dark and enclosed spaces (on a trail)   Relieving factors: no known relieving factors  Progression of symptoms: worse  OCULOMOTOR EXAM:  Ocular Alignment: L eye resting slightly medially, possible anisometropia?   Ocular ROM: No  Limitations  Spontaneous Nystagmus: absent  Gaze-Induced Nystagmus: absent  Smooth Pursuits: intact  Saccades: extra eye movements  Convergence/Divergence: 5-6 cm   Cover-cross-cover test: Normal  VESTIBULAR - OCULAR REFLEX:   Slow VOR: Normal  VOR Cancellation: Corrective Saccades  Head-Impulse Test: HIT Right: negative HIT Left: positive   POSITIONAL TESTING: TBA PRN    Vitals:   08/06/24 1624 08/06/24 1630  BP: (!) 173/113 (!) 168/109  Pulse: (!) 104 (!) 104     MCTSIB: -condition 1: 30s -condition 2: 17s -condition 3: 30s -condition 4: 5s  TREATMENT:     Vitals:   08/06/24 1624 08/06/24 1630  BP: (!) 173/113 (!) 168/109  Pulse: (!) 104 (!) 104   Pt reports he is stressed and anxious today and does not take BP medication and does not take it at home. Pt reports that he is asymptomatic. Discussed BP parameters for therapy as his BP and HR are very elevated. Educated that pt should monitor his BP at home to see if it has a pattern of being elevated as he might need to be on MD medication and discussed risk of CVA if BP remains elevated. Pt verbalized understanding. Re-assessed BP again after pt had performed deep breathing, but it was still high. Arrived no charge for visit today due to BP, educated for pt to go home and relax and if pt becomes symptomatic with high BP, then will need to go to the ER. Pt verbalized understanding.     PATIENT EDUCATION: Education details: See above Person educated: Patient Education method: Explanation Education comprehension: verbalized understanding and needs further education  HOME EXERCISE PROGRAM:  GOALS: Goals reviewed with patient? Yes  SHORT TERM GOALS: = LTG based on PT POC length   LONG TERM GOALS: Target date: 08/30/24   Pt will be independent with final HEP for improved balance  Baseline: to  be provided Goal status: INITIAL  2.  Patient will complete condition 2 of MCTSIB for >/=30s for improved balance and decreased risk of falls Baseline: 17s Goal status: INITIAL  3.  Patient will complete condition 4 of MCTSIB for >/=30s to demonstrate improved balance and decreased risk for falls Baseline: 5s Goal status: INITIAL  4.  FGA goal Baseline: to be completed  Goal status: INITIAL   ASSESSMENT:  CLINICAL IMPRESSION: Arrived no charge due to elevated BP (see above).  OBJECTIVE IMPAIRMENTS: Abnormal gait, difficulty walking, dizziness, and impaired vision/preception.   ACTIVITY LIMITATIONS: stairs, locomotion level, and caring for others  PARTICIPATION LIMITATIONS: interpersonal relationship, driving, shopping, community activity, occupation, and yard work  PERSONAL FACTORS: Age, Fitness, Past/current experiences, Profession, Time since onset of injury/illness/exacerbation, and 3+ comorbidities: see above are also affecting patient's functional outcome.   REHAB POTENTIAL: Fair chronicity and etiology   CLINICAL DECISION MAKING: Stable/uncomplicated  EVALUATION COMPLEXITY: Low   PLAN:  PT FREQUENCY: 2x/week  PT DURATION: 4 weeks  PLANNED INTERVENTIONS: 97164- PT Re-evaluation, 97750- Physical Performance Testing, 97110-Therapeutic exercises, 97530- Therapeutic activity, [id number]- Neuromuscular re-education, 97535- Self Care, [id number]- Manual therapy, [id number]- Gait training, [phone]- Canalith repositioning, [id number]- Aquatic Therapy, [phone] (1-2 muscles), [id number] (3+ muscles)- Dry Needling, Patient/Family education, Balance training, Stair training, Vestibular training, Visual/preceptual remediation/compensation, Cognitive remediation, and DME instructions  PLAN FOR NEXT SESSION:CHECK BP!!!   FGA + goal, corner balance HEP, VOR, saccades, tasks to challenge/improve depth perception (possibly is due to different rx in each eye?)   Sheffield Senate, PT, DPT 08/06/24 4:40  PM

## 2024-08-08 ENCOUNTER — Encounter: Payer: Self-pay | Admitting: Physical Therapy

## 2024-08-08 ENCOUNTER — Ambulatory Visit: Payer: Self-pay | Admitting: Physical Therapy

## 2024-08-08 VITALS — BP 169/103 | HR 109

## 2024-08-08 DIAGNOSIS — R2681 Unsteadiness on feet: Secondary | ICD-10-CM

## 2024-08-08 DIAGNOSIS — R2689 Other abnormalities of gait and mobility: Secondary | ICD-10-CM

## 2024-08-08 DIAGNOSIS — R42 Dizziness and giddiness: Secondary | ICD-10-CM

## 2024-08-08 NOTE — Therapy (Signed)
 OUTPATIENT PHYSICAL THERAPY VESTIBULAR TREATMENT - ARRIVED NO CHARGE     Patient Name: Casey Wilcox MRN: [id number] DOB:[birth year], 68 y.o., male Today's Date: 08/08/2024  END OF SESSION:  PT End of Session - 08/08/24 1018     Visit Number 2    Number of Visits 9    Date for Recertification  08/30/24    Authorization Type Medicare + BCBS    PT Start Time 1017    PT Stop Time 1027   arrived no charge due to high BP   PT Time Calculation (min) 10 min    Activity Tolerance Treatment limited secondary to medical complications (Comment)   high BP   Behavior During Therapy [facility] for tasks assessed/performed          Past Medical History:  Diagnosis Date   Arthritis 04/16/2021   shoulder and hands oa   Balance problem 04/16/2021   occ due to left acoustic neuroma   COVID 11/2020   sniffles x 3 to 5 days all symptoms resolved   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   History of radiation therapy    tumor in left ear and for prostate cacner   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/auditory canals mri care everywhere   Prostate cancer (HCC) 04/16/2021   Umbilical hernia 04/16/2021   asymptomatic   Wears glasses 04/16/2021   Past Surgical History:  Procedure Laterality Date   COLONOSCOPY WITH PROPOFOL  N/A 12/20/2016   Procedure: COLONOSCOPY WITH PROPOFOL ;  Surgeon: Gladis MARLA Louder, MD;  Location: [facility];  Service: Endoscopy;  Laterality: N/A;   CYSTOSCOPY  04/22/2021   Procedure: CYSTOSCOPY FLEXIBLE;  Surgeon: Matilda Senior, MD;  Location: [facility];  Service: Urology;;   FOOT SURGERY Bilateral    HERNIA REPAIR     inguinal   RADIOACTIVE SEED IMPLANT N/A 04/22/2021   Procedure: RADIOACTIVE SEED IMPLANT/BRACHYTHERAPY IMPLANT;  Surgeon: Matilda Senior, MD;  Location: [facility];  Service: Urology;  Laterality: N/A;  69 seeds implanted   SPACE OAR  INSTILLATION N/A 04/22/2021   Procedure: SPACE OAR INSTILLATION;  Surgeon: Matilda Senior, MD;  Location: [facility];  Service: Urology;  Laterality: N/A;   TRANSFORAMINAL LUMBAR INTERBODY FUSION (TLIF) WITH PEDICLE SCREW FIXATION 1 LEVEL Right 04/10/2024   Procedure: RIGHT SIDED LUMBAR THREE-LUMBAR FOUR TRANSFORAMINAL LUMBAR INTERBODY FUSION (TLIF) AND DECOMPRESSION WITH INSTRUMENTATION AND ALLOGRAFT;  Surgeon: Beuford Anes, MD;  Location: [facility];  Service: Orthopedics;  Laterality: Right;   Patient Active Problem List   Diagnosis Date Noted   Radiculopathy, lumbar region 04/10/2024   Fever 05/25/2021   Dysuria 05/25/2021   Constipation 05/25/2021   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   History of adenomatous polyp of colon 12/29/2020   Hypercholesterolemia 12/29/2020   Localized, 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/2020   Malignant neoplasm of prostate (HCC) 12/29/2020   Sleep disturbance 12/29/2020   Squamous cell carcinoma of skin of face 12/29/2020   Type 2 diabetes mellitus without complications (HCC) 12/29/2020   Asymmetrical left sensorineural hearing loss 11/19/2019   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HCC) 11/19/2019    PCP: Dorn Sauers, MD REFERRING PROVIDER: Franky Lesser, DC  REFERRING DIAG: D33.3 (ICD-10-CM) - Benign neoplasm of cranial nerves   THERAPY DIAG:  Dizziness and giddiness  Unsteadiness on feet  Other abnormalities of gait and mobility  ONSET DATE: 07/31/24 referral   Rationale for Evaluation and Treatment: Rehabilitation  SUBJECTIVE:   SUBJECTIVE STATEMENT:  Feels like his BP is high today. Needs to get a BP cuff.   Pt accompanied by: self  PERTINENT HISTORY: arthritis, DM 2, L acoustic neuroma, prostate CA, B cataracts, h/o Lasik sx  PAIN:  Are you having pain? None today   PRECAUTIONS: Fall  RED FLAGS: Cervical red flags: Dysphagia No, Dysmetria No,  Diplopia No, Nystagmus No, and Nausea No   WEIGHT BEARING RESTRICTIONS: No  FALLS: Has patient fallen in last 6 months? Yes. Number of falls 3-4; related to depth perception   LIVING ENVIRONMENT: Lives with: lives with an adult companion Lives in: House/apartment Stairs: No Has following equipment at home: Single point cane, Environmental Consultant - 2 wheeled, and shower chair  PLOF: Independent driving, relator, avid skier/ snowboarder  PATIENT GOALS: try to get my balance back  OBJECTIVE:  Note: Objective measures were completed at Evaluation unless otherwise noted.  DIAGNOSTIC FINDINGS: none applicable   COGNITION: Overall cognitive status: Within functional limits for tasks assessed   SENSATION: WFL  POSTURE:  forward head  Cervical ROM:   Slightly limited, more of an ache  STRENGTH: WFL  BED MOBILITY:  Independent, denies dizziness  GAIT: Gait pattern: step through pattern and wide BOS Comments: B feet IR   VESTIBULAR ASSESSMENT:  GENERAL OBSERVATION: NAD, no AD   SYMPTOM BEHAVIOR:  Subjective history: see above, vertigo symptoms progressively getting worse (even more so after back sx); describes vertigo as more of a momentary increase in his unbalance  Non-Vestibular symptoms: changes in hearing and nausea/vomiting  Type of dizziness: Imbalance (Disequilibrium)  Frequency: daily   Duration: throughout the day   Aggravating factors: Worse in the dark and enclosed spaces (on a trail)   Relieving factors: no known relieving factors  Progression of symptoms: worse  OCULOMOTOR EXAM:  Ocular Alignment: L eye resting slightly medially, possible anisometropia?   Ocular ROM: No Limitations  Spontaneous Nystagmus: absent  Gaze-Induced Nystagmus: absent  Smooth Pursuits: intact  Saccades: extra eye movements  Convergence/Divergence: 5-6 cm   Cover-cross-cover test: Normal  VESTIBULAR - OCULAR REFLEX:   Slow VOR: Normal  VOR Cancellation: Corrective  Saccades  Head-Impulse Test: HIT Right: negative HIT Left: positive   POSITIONAL TESTING: TBA PRN    Vitals:   08/08/24 1019  BP: (!) 169/103  Pulse: (!) 109      MCTSIB: -condition 1: 30s -condition 2: 17s -condition 3: 30s -condition 4: 5s                                                                                                                            TREATMENT:     Vitals:   08/08/24 1019  BP: (!) 169/103  Pulse: (!) 109   Pt reports he is asymptomatic.   Pt reports he has been more stressed. He reports he has also not been able to exercise as  much due to his balance and this is contributing to his stress. Pt also had elevated BP at previous session this week above safe BP parameters to participate in therapy and HR is also high at rest. Educated that stress can also contribute to high BP. Pt planning on getting a BP cuff to monitor and write down at home. Pt planning on reaching out to his PCP regarding this and pt wrote down BP values from today and previous session earlier this week. Discussed going on hold from PT at this time until BP is addressed with his PCP, pt in agreement with this plan. Discussed pt can call in the next 30 days if able to make another appt or if it is >30 days will need a new referral to return.   Pt verbalized understanding. If pt becomes symptomatic with high BP, then will need to go to the ER.   PATIENT EDUCATION: Education details: See above Person educated: Patient Education method: Explanation Education comprehension: verbalized understanding and needs further education  HOME EXERCISE PROGRAM:  GOALS: Goals reviewed with patient? Yes  SHORT TERM GOALS: = LTG based on PT POC length   LONG TERM GOALS: Target date: 08/30/24   Pt will be independent with final HEP for improved balance  Baseline: to be provided Goal status: INITIAL  2.  Patient will complete condition 2 of MCTSIB for >/=30s for improved balance and  decreased risk of falls Baseline: 17s Goal status: INITIAL  3.  Patient will complete condition 4 of MCTSIB for >/=30s to demonstrate improved balance and decreased risk for falls Baseline: 5s Goal status: INITIAL  4.  FGA goal Baseline: to be completed  Goal status: INITIAL   ASSESSMENT:  CLINICAL IMPRESSION: Arrived no charge due to elevated BP (see above). Pt to go on hold from PT at this time until BP/stress is addressed with his PCP. Pt planning on reaching out to PCP and is in agreement with plan.   OBJECTIVE IMPAIRMENTS: Abnormal gait, difficulty walking, dizziness, and impaired vision/preception.   ACTIVITY LIMITATIONS: stairs, locomotion level, and caring for others  PARTICIPATION LIMITATIONS: interpersonal relationship, driving, shopping, community activity, occupation, and yard work  PERSONAL FACTORS: Age, Fitness, Past/current experiences, Profession, Time since onset of injury/illness/exacerbation, and 3+ comorbidities: see above are also affecting patient's functional outcome.   REHAB POTENTIAL: Fair chronicity and etiology   CLINICAL DECISION MAKING: Stable/uncomplicated  EVALUATION COMPLEXITY: Low   PLAN:  PT FREQUENCY: 2x/week  PT DURATION: 4 weeks  PLANNED INTERVENTIONS: 97164- PT Re-evaluation, 97750- Physical Performance Testing, 97110-Therapeutic exercises, 97530- Therapeutic activity, [id number]- Neuromuscular re-education, 97535- Self Care, 02859- Manual therapy, [phone]- Gait training, [phone]- Canalith repositioning, [id number]- Aquatic Therapy, [phone] (1-2 muscles), [id number] (3+ muscles)- Dry Needling, Patient/Family education, Balance training, Stair training, Vestibular training, Visual/preceptual remediation/compensation, Cognitive remediation, and DME instructions  PLAN FOR NEXT SESSION:CHECK BP!!! Pt only hold from PT at this time until he follows up with PCP regarding stress/BP    FGA + goal, corner balance HEP, VOR, saccades, tasks to challenge/improve depth  perception (possibly is due to different rx in each eye?)   Sheffield Senate, PT, DPT 08/08/24 10:31 AM
